# Patient Record
Sex: Male | Born: 1935 | Race: White | Hispanic: No | Marital: Married | Smoking: Former smoker
Health system: Southern US, Community
[De-identification: ages and names within clinical notes are randomized; demographics above are authoritative.]

## PROBLEM LIST (undated history)

## (undated) DIAGNOSIS — R739 Hyperglycemia, unspecified: Secondary | ICD-10-CM

## (undated) DIAGNOSIS — I5030 Unspecified diastolic (congestive) heart failure: Secondary | ICD-10-CM

## (undated) DIAGNOSIS — I2510 Atherosclerotic heart disease of native coronary artery without angina pectoris: Secondary | ICD-10-CM

## (undated) DIAGNOSIS — R079 Chest pain, unspecified: Secondary | ICD-10-CM

## (undated) DIAGNOSIS — I10 Essential (primary) hypertension: Secondary | ICD-10-CM

## (undated) HISTORY — DX: Unspecified diastolic (congestive) heart failure: I50.30

## (undated) HISTORY — PX: TOTAL HIP ARTHROPLASTY: SHX124

---

## 2011-05-20 ENCOUNTER — Inpatient Hospital Stay: Payer: Self-pay | Admitting: Cardiovascular Disease

## 2011-05-20 LAB — CBC
HCT: 41.2 % (ref 40.0–52.0)
HGB: 13.9 g/dL (ref 13.0–18.0)
HGB: 14.4 g/dL (ref 13.0–18.0)
MCH: 32.6 pg (ref 26.0–34.0)
MCHC: 34.1 g/dL (ref 32.0–36.0)
Platelet: 216 10*3/uL (ref 150–440)
RBC: 4.26 10*6/uL — ABNORMAL LOW (ref 4.40–5.90)
RBC: 4.33 10*6/uL — ABNORMAL LOW (ref 4.40–5.90)
RDW: 13 % (ref 11.5–14.5)
WBC: 11.6 10*3/uL — ABNORMAL HIGH (ref 3.8–10.6)

## 2011-05-20 LAB — COMPREHENSIVE METABOLIC PANEL
Albumin: 3.5 g/dL (ref 3.4–5.0)
Alkaline Phosphatase: 64 U/L (ref 50–136)
BUN: 23 mg/dL — ABNORMAL HIGH (ref 7–18)
Bilirubin,Total: 0.4 mg/dL (ref 0.2–1.0)
Chloride: 98 mmol/L (ref 98–107)
Creatinine: 1.22 mg/dL (ref 0.60–1.30)
EGFR (Non-African Amer.): >60
Glucose: 218 mg/dL — ABNORMAL HIGH (ref 65–99)
SGOT(AST): 53 U/L — ABNORMAL HIGH (ref 15–37)
SGPT (ALT): 52 U/L
Total Protein: 7.8 g/dL (ref 6.4–8.2)

## 2011-05-20 LAB — TROPONIN I
Troponin-I: 0.19 ng/mL — ABNORMAL HIGH
Troponin-I: 7.15 ng/mL — ABNORMAL HIGH
Troponin-I: 10.43 ng/mL — ABNORMAL HIGH

## 2011-05-20 LAB — BASIC METABOLIC PANEL
BUN: 20 mg/dL — ABNORMAL HIGH (ref 7–18)
Calcium, Total: 9.7 mg/dL (ref 8.5–10.1)
Co2: 31 mmol/L (ref 21–32)
EGFR (African American): >60
Glucose: 107 mg/dL — ABNORMAL HIGH (ref 65–99)
Osmolality: 284 (ref 275–301)
Potassium: 4.7 mmol/L (ref 3.5–5.1)
Sodium: 141 mmol/L (ref 136–145)

## 2011-05-20 LAB — CK TOTAL AND CKMB (NOT AT ARMC)
CK, Total: 100 U/L (ref 35–232)
CK, Total: 331 U/L — ABNORMAL HIGH (ref 35–232)

## 2011-05-20 LAB — PROTIME-INR: INR: 1.2

## 2011-05-20 LAB — APTT
Activated PTT: 117.6 secs — ABNORMAL HIGH (ref 23.6–35.9)
Activated PTT: 117.5 secs — ABNORMAL HIGH (ref 23.6–35.9)

## 2011-05-21 LAB — CBC
HCT: 39.8 % — ABNORMAL LOW (ref 40.0–52.0)
MCHC: 34.3 g/dL (ref 32.0–36.0)
Platelet: 215 10*3/uL (ref 150–440)
RDW: 13.3 % (ref 11.5–14.5)
WBC: 11.8 10*3/uL — ABNORMAL HIGH (ref 3.8–10.6)

## 2011-05-21 LAB — BASIC METABOLIC PANEL
Anion Gap: 12 (ref 7–16)
BUN: 21 mg/dL — ABNORMAL HIGH (ref 7–18)
Chloride: 99 mmol/L (ref 98–107)
Creatinine: 0.99 mg/dL (ref 0.60–1.30)
Glucose: 113 mg/dL — ABNORMAL HIGH (ref 65–99)
Potassium: 3.8 mmol/L (ref 3.5–5.1)
Sodium: 139 mmol/L (ref 136–145)

## 2011-05-21 LAB — LIPID PANEL
Cholesterol: 146 mg/dL (ref 0–200)
HDL Cholesterol: 21 mg/dL — ABNORMAL LOW (ref 40–60)
Ldl Cholesterol, Calc: 81 mg/dL (ref 0–100)
Triglycerides: 219 mg/dL — ABNORMAL HIGH (ref 0–200)
VLDL Cholesterol, Calc: 44 mg/dL — ABNORMAL HIGH (ref 5–40)

## 2011-05-21 LAB — HEMOGLOBIN A1C: Hemoglobin A1C: 6.7 % — ABNORMAL HIGH (ref 4.2–6.3)

## 2011-05-21 LAB — PROTIME-INR
INR: 1.1
Prothrombin Time: 14.7 secs (ref 11.5–14.7)

## 2011-05-25 HISTORY — PX: CORONARY ARTERY BYPASS GRAFT: SHX141

## 2014-02-27 ENCOUNTER — Ambulatory Visit: Payer: Self-pay | Admitting: Internal Medicine

## 2014-02-27 ENCOUNTER — Observation Stay: Payer: Self-pay | Admitting: Internal Medicine

## 2014-02-27 LAB — BASIC METABOLIC PANEL
Anion Gap: 8 (ref 7–16)
BUN: 19 mg/dL — ABNORMAL HIGH (ref 7–18)
CALCIUM: 8.4 mg/dL — AB (ref 8.5–10.1)
Chloride: 102 mmol/L (ref 98–107)
Co2: 28 mmol/L (ref 21–32)
Creatinine: 1.26 mg/dL (ref 0.60–1.30)
EGFR (Non-African Amer.): 58 — ABNORMAL LOW
GLUCOSE: 144 mg/dL — AB (ref 65–99)
OSMOLALITY: 280 (ref 275–301)
POTASSIUM: 3.6 mmol/L (ref 3.5–5.1)
Sodium: 138 mmol/L (ref 136–145)

## 2014-02-27 LAB — CBC WITH DIFFERENTIAL/PLATELET
BASOS ABS: 0.1 10*3/uL (ref 0.0–0.1)
Basophil %: 0.9 %
EOS ABS: 0.2 10*3/uL (ref 0.0–0.7)
EOS PCT: 1.1 %
HCT: 44.7 % (ref 40.0–52.0)
HGB: 15 g/dL (ref 13.0–18.0)
LYMPHS ABS: 3.5 10*3/uL (ref 1.0–3.6)
LYMPHS PCT: 25.7 %
MCH: 32.7 pg (ref 26.0–34.0)
MCHC: 33.5 g/dL (ref 32.0–36.0)
MCV: 98 fL (ref 80–100)
MONOS PCT: 10.9 %
Monocyte #: 1.5 x10 3/mm — ABNORMAL HIGH (ref 0.2–1.0)
Neutrophil #: 8.3 10*3/uL — ABNORMAL HIGH (ref 1.4–6.5)
Neutrophil %: 61.4 %
Platelet: 240 10*3/uL (ref 150–440)
RBC: 4.59 10*6/uL (ref 4.40–5.90)
RDW: 13.6 % (ref 11.5–14.5)
WBC: 13.5 10*3/uL — ABNORMAL HIGH (ref 3.8–10.6)

## 2014-02-27 LAB — CK TOTAL AND CKMB (NOT AT ARMC)
CK, TOTAL: 86 U/L (ref 39–308)
CK, TOTAL: 76 U/L (ref 39–308)
CK, Total: 92 U/L (ref 39–308)
CK-MB: 1.5 ng/mL (ref 0.5–3.6)
CK-MB: 1.4 ng/mL (ref 0.5–3.6)
CK-MB: 1.2 ng/mL (ref 0.5–3.6)

## 2014-02-27 LAB — TROPONIN I
Troponin-I: <0.02 ng/mL
Troponin-I: <0.02 ng/mL
Troponin-I: <0.02 ng/mL

## 2014-06-30 NOTE — H&P (Signed)
PATIENT NAME:  [name], [name] MR#:  [id number] DATE OF BIRTH:  [birth year]  DATE OF ADMISSION:  02/27/2014  PRIMARY CARE PHYSICIAN:  Rockney Gheehelsea Holland, nurse practitioner with [facility].    CARDIOLOGIST: Dr. Juliann Paresallwood.   CHIEF COMPLAINT: "I passed out today."  HISTORY OF PRESENT ILLNESS: [name] is an 81 year old Caucasian gentleman with past medical history of coronary artery disease status post CABG, history of hypertension,  comes to the Emergency Room after he had a syncopal episode at [employer] today. The patient had a routine cardiac catheterization scheduled by Dr. Juliann Paresallwood which was done by Dr. Juliann Paresallwood this morning.  The patient went home after he was told to continue medical management. No intervention was performed. I do not have the results of the cardiac catheterization that was done today. The patient after the test directly went to [employer], had some eggs and grits along with [place] sausage and some hash browns. Immediately after that in a few minutes he started feeling sick to his stomach, he laid his head down on the table and by the time family could grab hold of him he collapsed and family laid him down on the floor. EMS came by and once EMS arrived his blood pressure was systolic in the 70s. He was brought to the Emergency Room, received IV fluids, his blood pressure systolic is 107. He had multiple episodes of vomiting. He still feels "queasy" and nauseated. He is being admitted for syncope likely vasovagal with possible secondary to nausea and upset stomach after his meal.   PAST MEDICAL HISTORY:  1.  Coronary artery disease status post CABG in the past.  2.  Recent cardiac catheterization today which results not available. The patient was told to continue medical management.  3.  Hypertension.   ALLERGIES:  PENICILLIN.   FAMILY HISTORY: Positive for hypertension.   SOCIAL HISTORY: Married, lives with wife. Nonsmoker. Denies any alcohol use.  HOME  MEDICATIONS:  1. Allopurinol 100 mg daily.  2. Amlodipine 10 mg daily.  3. Aspirin 81 mg daily.  4. Lasix 20 mg daily.  5. Metoprolol 25 mg b.i.d.  6. Ramipril daily.  7. Nitrostat 0.4 mg sublingual every 5 minutes as needed for chest pain.   REVIEW OF SYSTEMS:  CONSTITUTIONAL: No fever. Positive for fatigue and weakness. No pain, weight loss, weight gain.  EYES: No blurred or double vision.  No glaucoma or cataracts.  EARS, NOSE, AND THROAT: No tinnitus, ear pain, epistaxis, discharge.   RESPIRATORY: No cough, wheeze, hemoptysis, or dyspnea.  CARDIOVASCULAR: No chest pain, orthopnea, edema. Positive for hypotension.  GASTROINTESTINAL: Positive for nausea, vomiting. No hematemesis, melena, or rectal bleed.  GENITOURINARY: No dysuria, hematuria, frequency.  ENDOCRINE: No polyuria, nocturia, or thyroid problems.  HEMATOLOGY: No anemia or easy bruising or bleeding.   SKIN:  No acne, rash, or lesion.  MUSCULOSKELETAL:  Positive for back pain.  NEUROLOGIC: No CVA, TIA dysarthria PSYCHIATRIC: No anxiety or depression.   All other systems reviewed and negative.   PHYSICAL EXAMINATION:  GENERAL: The patient is awake, alert, oriented x 3, not in acute distress.  VITAL SIGNS: Afebrile. Pulse is 73 and regular, blood pressure is 137/72, saturation is 100% on room air.  HEENT: Atraumatic, normocephalic. Pupils PERRLA. EOMI intact. Oral mucosa is moist.  NECK: Supple. No JVD. No carotid bruit.  LUNGS: Clear to auscultation bilaterally. No rales, rhonchi, respiratory distress, labored breathing.  CARDIOVASCULAR: Both heart sounds are normal. Rate and rhythm regular. PMI not lateralized. Chest nontender.  EXTREMITIES: Good pedal pulses, good femoral pulses. No lower extremity edema. ABDOMEN: Soft, benign, nontender. No organomegaly. Positive bowel sounds.  NEUROLOGIC: Grossly intact cranial nerves II through XII. No motor or sensory deficit. PSYCHIATRIC: The patient is awake, alert, oriented x  3.   EKG shows normal sinus rhythm. Right bundle branch block. No acute ST elevation or depression.    LABORATORY DATA:  CBC within normal limits. No acute findings on CT of the chest, abdomen, and pelvis, extensive aortic atherosclerosis, no aortic dissection or aneurysm. Extensive anatomic radiations including azygos continuation of the IVC, absence of normal midgut bowel rotation variant, aortic branching in the abdomen.   H and H is 15 and hematocrit is 44.7. Glucose is 144, BUN 19, creatinine is 1.2, sodium is 138, potassium 3.6, chloride is 102, bicarbonate is 28. Troponin is less than 0.02.   ASSESSMENT AND PLAN: An 81 year old, [name], with history of hypertension, history of coronary artery disease status post coronary artery bypass graft, comes in with:   1.  Syncopal episode. Comes in with syncopal episode, appears vasovagal in the setting of a heavy meal which possibly upset his stomach after cardiac catheterization this morning. The patient has had multiple episodes of vomiting. He is having some dry heaves right now. We will admit him for overnight observation, monitor cardiac enzymes, monitor on telemetry. CT abdomen and chest is negative for any retroperitoneal bleed. H and H are stable. Dr. Juliann Pares is aware of the patient being admitted. We will consult cardiology only if needed. Give IV fluids.  2.  Hypertension. We will resume home medications once blood pressure is more stable. 3.  Deep vein thrombosis prophylaxis. Subcutaneous heparin t.i.d.   Above was discussed with the patient and the patient's family members who were present in the Emergency Room.   TIME SPENT: 50 minutes.     ____________________________ Wylie Hail Allena Katz, MD sap:bu D: 02/27/2014 17:07:46 ET T: 02/27/2014 17:44:18 ET JOB#: [id number]  cc: Cynai Skeens A. Allena Katz, MD, <Dictator> Willow Ora MD ELECTRONICALLY SIGNED 03/02/2014 17:05

## 2014-07-01 NOTE — Discharge Summary (Signed)
PATIENT NAMLewis Erickson:  Eppes, [name] DATE OF BIRTH:  October 22, 1930  DATE OF ADMISSION:  05/20/2011 DATE OF DISCHARGE:  05/21/2011  ADMITTING DIAGNOSES:  1. Chest pain.  2. Hypertension.  DISCHARGE/TRANSFER DIAGNOSIS:  Non-ST elevation myocardial infarction.   HISTORY OF PRESENT ILLNESS: [name] is a 81 year old pleasant white male with a past medical history significant for hypertension who woke up yesterday at 3:00 a.m. to use the restroom. During that time he had substernal chest pain that radiated to his left axillary region. He thought the pain might have been indigestion and tried some Pepsi and antacids and belched and his symptoms resolved after about 10 minutes. He did not have any shortness of breath or diaphoresis but proceeded to the Emergency Department where he was given oxygen, nitroglycerin, beta blocker, and had a chest x-ray and EKG showing normal sinus rhythm with first degree AV block and no significant ST or T wave acute changes. The patient's initial troponin I and cardiac enzymes were negative; however, the second and third sets of enzymes were markedly abnormal with troponin I in the 10s and later in the 15s. The patient was brought to the cardiac catheterization lab where he was found to have significant three-vessel coronary artery disease with a high-grade lesion in the proximal circumflex of approximately 99%, which is probably the culprit for his non-ST elevation myocardial infarction. The patient did not have any associated chest pain on medical therapy after his initial ED visit and has been taking aspirin, heparin, beta blocker, Integrilin drip, and was clinically stable.   LABORATORY, DIAGNOSTIC, AND RADIOLOGICAL DATA:  Glucose 135, BUN 21, creatinine 0.99, sodium 139, potassium 3.8, chloride 99, CO2 28, estimated GFR is greater than 60, LDL 81, VLDL 44, total cholesterol is 146, triglycerides 219. HDL 21, hemoglobin A1c 6.7. Troponin I from 22:43  today was 10.43. White blood cell count 11.8, hemoglobin 13.7, hematocrit 39.8, platelet count 215,000. PT 14.7, INR 1.1, PTT 112.4. Echocardiogram 05/20/2011 with mild LV dysfunction with anterior hypokinesis and mild mitral regurgitation.  Cardiac catheterization 05/21/2011: Left ventricular ejection fraction calculated by contrast ventriculography was 50%, mid LAD with 75% stenosis, first diagonal with 70% stenosis, proximal circumflex with 99% stenosis, first obtuse marginal 80% stenosis, second obtuse marginal 60% stenosis, proximal RCA 80% stenosis, distal RCA 95% stenosis, right posterior lateral segment 85% stenosis.  Chest x-ray: No acute cardiopulmonary disease, mild prominent lobulation along the right superior hilum which may be related to prominent central pulmonary vasculature. Hilar/mediastinal mass cannot be excluded.   DISCHARGE INSTRUCTIONS: [facility] on [facility]. The patient has significant three-vessel coronary artery disease with a high-grade lesion, 99% in the proximal circumflex, which is probably the culprit for his non-ST elevation myocardial infarction. The patient will be transferred with aspirin, heparin, Integrilin drip, and is clinical clinically stable for transfer. He should follow up in our office after coronary artery bypass grafting, which is [facility].   Thank you very much for allowing us to participate in this patient's care.  ____________________________ Verta EllenMonica A. Apphia Cropley, PA-C for Adrian BlackwaterShaukat Khan, MD mam:bjt D: 05/21/2011 16:31:35 ET T: 05/22/2011 10:33:13 ET JOB#: [id number]  cc: Verta EllenMonica A. Janitza Revuelta, PA-C, <Dictator> Dr. Roel Cluckean, [place]  Melquiades Kovar A [facility] PA ELECTRONICALLY SIGNED 05/22/2011 13:39

## 2014-07-01 NOTE — H&P (Signed)
PATIENT NAME:  [name], POTTS MR#:  [id number] DATE OF BIRTH:  09/04/1930  DATE OF ADMISSION:  05/20/2011  ED REFERRING PHYSICIAN: Dr. Marilynne Halsted PRIMARY CARE PHYSICIAN: Dr. August Saucer in [place]    CHIEF COMPLAINT: Chest pressure.   HISTORY OF PRESENT ILLNESS: Patient is a 81 year old white male with history of hypertension, osteoarthritis who reports that he was sleeping and woke up around 3:00 a.m. went to the bathroom when he started having chest pressure. He reports that it felt like elephant sitting on his chest so he took two tablets of antacid and he burped and that did seem to help relieve his symptoms. However, patient reports also that he has been experiencing similar symptoms for the past one month. Patient did not have any radiation of the pain today to his neck or jaw. He did have some pain in his left armpit. He reports that over the past one month he has been having chest pressure mainly with exertion and it gets relieved. Also had it a few days ago while he was sitting. He otherwise denies any nocturnal dyspnea, orthopnea. No lower extremity swelling. He also was noted to have a blood sugar of 218 in the ED. He reports no history of diabetes. Denies any excessive thirst or excessive urination.   PAST MEDICAL HISTORY:  1. Hypertension.  2. Osteoarthritis.   PAST SURGICAL HISTORY: None.   ALLERGIES: None.   MEDICATIONS:  1. HCTZ 25 p.o. daily.  2. Aspirin 81, 1 tab p.o. daily.  3. Altace 5 mg p.o. daily.   SOCIAL HISTORY: Does not smoke. Does not drink. No drugs.   FAMILY HISTORY: Father with coronary artery disease. He is not sure the timing or at what age.  REVIEW OF SYSTEMS: CONSTITUTIONAL: Denies any fevers, fatigue, weakness. Has pain related to osteoarthritis. No weight loss. No weight gain. EYES: No blurred or double vision. No pain. No redness. No inflammation. No glaucoma. No cataracts. ENT: No tinnitus. No ear pain. No hearing loss. No seasonal or year-round  allergies. No epistaxis. No nasal discharge. No snoring. No postnasal drip. No sinus pain. No redness of the oropharynx. No difficulty swallowing. RESPIRATORY: No cough. No wheezing. No hemoptysis. No dyspnea. No asthma. No chronic obstructive pulmonary disease. No tuberculosis. No pneumonia. CARDIOVASCULAR: Chest pressure as above. No orthopnea. No edema. No arrhythmia. No palpitations. Has high has high blood pressure. GASTROINTESTINAL: No nausea, vomiting, diarrhea. No abdominal pain. No hematemesis. No melena. No gastroesophageal reflux disease. No irritable bowel syndrome. No jaundice. No rectal bleeding. GENITOURINARY: Denies any dysuria, hematuria, renal calculus, frequency or incontinence. ENDO: Denies any polyuria, nocturia, or thyroid problems. HEME/LYMPH: Denies any anemia, easy bruisability, or bleeding. SKIN: No acne. No rash. No change in mole, hair or skin. MUSCULOSKELETAL: Has pain in his back, hips, knees related to osteoarthritis. No gout. NEUROLOGIC: No numbness. No cerebrovascular accident. No transient ischemic attack. No seizures. PSYCHIATRIC: No anxiety. No insomnia. No ADD. No OCD.   PHYSICAL EXAMINATION:  VITAL SIGNS: Temperature 97.8, pulse 62, respirations 18, blood pressure 139/67, O2 100%.   GENERAL: Patient is an obese male in no acute distress.   HEENT: Head atraumatic, normocephalic. Pupils equally round, reactive to light and accommodation. Sclerae anicteric. No conjunctival pallor. Oropharynx clear without any exudates. Nasal exam shows no drainage or ulceration. Ear exam shows no drainage or erythema.   CARDIOVASCULAR: Regular rate and rhythm. No murmurs, rubs, clicks, or gallops. PMI is not displaced.   LUNGS: Clear to auscultation bilaterally without any rales, rhonchi, wheezing.  ABDOMEN: Soft, nontender, nondistended. Positive bowel sounds x4.   EXTREMITIES: No clubbing, cyanosis, edema.   NEUROLOGIC: Awake, alert, oriented x3. No focal deficits.   SKIN:  There is no rash.   LYMPHATICS: No lymph nodes palpable.   MUSCULOSKELETAL: There is no erythema or swelling.   PSYCHIATRIC: Not anxious or depressed.   VASCULAR: Good DP, PT pulses.   LABORATORY, DIAGNOSTIC AND RADIOLOGICAL DATA: EKG shows right bundle branch block without any acute ST elevation or depression. Glucose 218, BUN 23, creatinine 1.22, sodium 140, potassium 4.0, chloride 98, CO2 29, calcium 9.6. LFTs: Total protein 7.8, albumin 3.5, bilirubin total 0.4, alkaline phosphatase 64, AST 53, ALT 52. CPK 100, CK-MB 1.7, troponin 0.19. WBC 9.4, hemoglobin 13.9, platelet count 229. Chest x-ray shows no acute cardiopulmonary processes. Mild prominent lobulation along the right superior hilum may be related to prominent central pulmonary vasculature. Follow-up chest x-ray recommended.   ASSESSMENT AND PLAN: Patient is a 81 year old white male with history of hypertension, osteoarthritis presents with chest pressure which woke him up. Patient has been having chest pressure with exertion ongoing for past one month.  1. Chest pressure concerning for unstable angina. At this time will go ahead and admit the patient to telemetry. Start him on heparin drip. Place him on aspirin, metoprolol. I will start him on Lipitor. Will check a fasting lipid panel in the a.m. I have spoken to Dr. Welton FlakesKhan who will see the patient later today. Will give him p.r.n. nitroglycerin as needed.  2. Elevated blood glucose, likely new onset diabetes. At this time will do sliding scale insulin. Check a hemoglobin A1c.   3. Hypertension. Will place him on metoprolol. Hold HCTZ and Altace for now.  4. Abnormal chest x-ray. Will need outpatient follow up. Repeat chest x-ray as recommended by radiology.        TIME SPENT:  35 minutes.   ____________________________ Lacie ScottsShreyang H. Allena KatzPatel, MD shp:cms D: 05/20/2011 08:44:44 ET T: 05/20/2011 09:39:14 ET JOB#: [id number]  cc: Malia Corsi H. Allena KatzPatel, MD, <Dictator> Dr. [place]   Almer Littleton Molinda BailiffH Cobi Aldape MD ELECTRONICALLY SIGNED 05/23/2011 13:13

## 2014-07-01 NOTE — Consult Note (Signed)
Brief Consult Note: Diagnosis: Chest pain-patient had episode of CP early this am that has resolved. EKG with no acute changes.   Consult note dictated.   Orders entered.   Discussed with Attending MD.   Comments: Await second set of TNI, if elevated or has CP will proceed with cardiac cath. If no gross elevations, then nuclaer stress test. Patient on heparin gtt, beta-blockers, nitrates. Will get echo to r/o wall motion abnormalities and follow patient.  Electronic Signatures: Radene KneeKhan, Maeci Kalbfleisch Ali (MD)   (Signed 14-Mar-13 10:38)  Authored: Brief Consult Note Manzi, Monica A (PA-C)   Entered: Brief Consult Note  Last Updated: 14-Mar-13 10:38 by Radene KneeKhan, Amiayah Giebel Ali (MD)

## 2014-07-01 NOTE — Consult Note (Signed)
PATIENT NAME:  [name], [name] MR#:  [id number] DATE OF BIRTH:  [birth year]  DATE OF CONSULTATION:  05/20/2011  REFERRING PHYSICIAN:  Auburn Bilberry, MD  CONSULTING PHYSICIAN:  Verta Ellen, PA-C  PRIMARY CARE PHYSICIAN: Dr. August Saucer in [place]    REASON FOR CONSULTATION: Chest pain.   HISTORY OF PRESENT ILLNESS: [name] is a pleasant 81 year old white male with a past medical history significant for hypertension who woke up at 3 a.m. to use the restroom. During that time he complained of substernal chest pain that radiated into his left axillary region and had pressure in that area. He took two TUMS, drank some Pepsi, belched and notes that his pain eased off. Pain lasted for a total of 5 to 10 minutes and was not associated with any shortness of breath or diaphoresis. He currently is chest pain free resting comfortably in the bed and denies any shortness of breath, palpitations, orthopnea, PND, and peripheral edema. He has never had any testing done on his heart in the past.   PAST MEDICAL HISTORY:  1. Hypertension. 2. Osteoporosis.   PAST SURGICAL HISTORY: None.  ALLERGIES: Tetanus shot caused edema and pruritus.   HOME MEDICATIONS:  1. Aspirin 81 mg p.o. daily.  2. Ramipril 5 mg p.o. daily.  3. Hydrochlorothiazide 25 mg 1 tablet p.o. daily.   SOCIAL HISTORY: The patient is married. He is a retired [employer] information [employer] manager. He does not smoke but smoked 1 to 2 packs per day and quit in 1990. He states he only smoked a "couple of years". He denies any alcohol or illicit drug use.   FAMILY HISTORY: His father died of an MI at age 48. Sister had throat cancer.   REVIEW OF SYSTEMS: The patient denies fevers, fatigue, weakness, unintentional weight loss, shortness of breath, coughing, wheezing, abdominal pain, edema. See the history of present illness.   PHYSICAL EXAMINATION:   GENERAL: This is an obese white male who is not in any acute distress. He is alert and  oriented x3.   VITAL SIGNS: Temperature 97.8 degrees Fahrenheit, heart rate 63, respiratory rate 20, blood pressure 156/76, oxygen saturation 99% on room air.   HEENT: Head atraumatic, normocephalic.   EYES: Pupils are round, equal bilaterally, reactive to light. There is no scleral icterus. Conjunctivae pale, pink. Ears and nose are normal to external inspection.   MOUTH: Dentures in place.   NECK: Supple. Trachea is midline. Thyroid is smooth and mobile.   LUNGS: Clear to auscultation bilaterally with no adventitious breath sounds appreciated.   CARDIOVASCULAR: Regular rate and rhythm. No murmurs, rubs, or gallops noted.   ABDOMEN: Nondistended. Bowel sounds soft. Bowel sounds present in all four quadrants. No rebound tenderness, guarding, or hepatosplenomegaly noted.   EXTREMITIES: The patient has trace pedal edema.   ANCILLARY DATA: EKG with normal sinus rhythm, 76 beats per minute, first degree AV block and right bundle branch block.   Glucose 218, BUN 23, creatinine 1.22, sodium 140, potassium 4.0, chloride 98, CO2 29, estimated GFR greater than 60, total protein 7.8, hemoglobin 13.9, hematocrit 41.2, platelet count 226,000. PTT 30.   Chest x-ray no acute cardiopulmonary disease, mild prominent lobulation along the right superior hilum.   ASSESSMENT/PLAN: Chest pain. The patient has a history of hypertension that has been controlled at home and was awakened with substernal chest pain. There are no acute EKG changes today and his troponin-I is very mildly elevated. Will continue to monitor cardiac enzymes and if enzymes elevated and/or  the patient has chest pain will consider cardiac catheterization versus nuclear stress testing. Will also order echocardiogram to rule out any wall motion abnormalities. The patient has already been started on heparin drip, beta-blockers, and nitroglycerin which we agree with. Will continue to monitor this patient with you.   Thank you very much for  this consultation and allowing us to participate in this patient's care. The patient was discussed with Dr. Adrian BlackwaterShaukat Khan who agrees with the assessment and management of this patient as noted above.   ____________________________ Verta EllenMonica A. Julian Askin, PA-C mam:drc D: 05/20/2011 11:55:43 ET T: 05/20/2011 13:10:38 ET JOB#: [id number]  cc: Verta EllenMonica A. Neftaly Swiss, PA-C, <Dictator> Dr. [place], [facility]  Kimanh Templeman A [facility] PA ELECTRONICALLY SIGNED 05/21/2011 8:33

## 2014-07-04 NOTE — Discharge Summary (Signed)
PATIENT NAMLewis Erickson:  Erickson, [name] MR#:  [id number] DATE OF BIRTH:  1930-03-21  DATE OF ADMISSION:  02/27/2014 DATE OF DISCHARGE:  02/28/2014  PRIMARY CARE PHYSICIAN: Gary OraSona A Patel, MD   DISCHARGING PHYSICIAN:  Gary Baasadhika Rodneshia Greenhouse, MD   PRIMARY CARE PHYSICIAN:  Gary GaribaldiSheikh A Tejan-Sie, MD   PRIMARY CARDIOLOGIST:  Gary NancyShaukat A Khan, MD   DISCHARGE DIAGNOSES:  1.  Vasovagal syncope.  2.  Coronary artery disease.  3.  History of congestive heart failure, systolic dysfunction, ejection fraction 35% to 50%.  4.  Hypertension.   DISCHARGE HOME MEDICATIONS:    1.  Aspirin 81 mg p.o. daily.  2.  Sublingual nitroglycerin 0.4 mg every 5 minutes as needed for chest pain.  3.  Metoprolol 25 mg p.o. b.i.d.  4.  Ramipril 10 mg p.o. daily.  5.  Allopurinol 100 mg p.o. daily.  6.  Norvasc 10 mg p.o. daily.   DISCHARGE DIET: Low-sodium diet.   DISCHARGE ACTIVITY: As tolerated.   FOLLOW-UP INSTRUCTIONS:   1.  Cardiology follow-up in one week.  2.  Primary care physician follow-up in one week.  3.  Advised to hold Lasix until January.  LABORATORY AND IMAGING STUDIES:  Prior to discharge, troponins came in negative. Chest x-ray showing clear lung fields. No acute cardiopulmonary disease. CT of the chest, abdomen and pelvis showing no acute findings identified, extensive right leg atherosclerosis, no  dissection, aneurysm, extensive anatomic  variations noted of azygous vein, nonspecific.   WBC 13.5, hemoglobin 15.3, hematocrit 44.7, platelet count 240,000. Sodium 138, potassium 3.6, chloride 102, bicarbonate 28, BUN 19, creatinine 1.26, glucose 144, calcium of 8.4.   BRIEF HOSPITAL COURSE:  [name] is an 81 year old male with past medical history significant for coronary artery disease status post bypass graft surgery, hypertension, and recent cardiac catheterization done on 02/27/2014. Post catheterization the patient went to eat at [employer] and passed out..  1.  Vasovagal syncope. Denies any  cardiac symptoms prior to that, well-dehydrated. Is taking Lasix at home, he just had a cardiac catheter, which showed nonspecific obstructions but medical management recommended. The patient's troponins were negative. He did not have any chest pain or dyspnea on exertion, so he is being discharged home and advised to follow up with Dr. Welton FlakesKhan as an outpatient. He is on cardiac medications and experienced congestive heart failure, the recent cardiac catheterization done yesterday showed ejection fraction 50%.  Since he appeared to be a little bit dehydrated his Lasix is being held at this time and advised to restart it in about a week after checking with PCP.  2.  Hypertension. Advised to continue his home medications.  His course has been otherwise uneventful in the hospital.   DISCHARGE CONDITION: Stable.   DISCHARGE DISPOSITION: Home.   TIME SPENT:  On  discharge was 40 minutes.   ____________________________ Gary Baasadhika Tilton Marsalis, MD rk:at D: 02/28/2014 16:27:09 ET T: 02/28/2014 17:10:11 ET JOB#: [id number]  cc: Gary Baasadhika Cartrell Bentsen, MD, <Dictator> Gary NancyShaukat A. Khan, MD  Gary BaasADHIKA Micky Sheller MD ELECTRONICALLY SIGNED 03/13/2014 18:33

## 2017-07-05 ENCOUNTER — Other Ambulatory Visit: Payer: Self-pay

## 2017-07-05 ENCOUNTER — Observation Stay (HOSPITAL_COMMUNITY)
Admission: EM | Admit: 2017-07-05 | Discharge: 2017-07-06 | Disposition: A | Discharge status: Home / Self Care | Payer: Medicare Other | Attending: Internal Medicine | Admitting: Internal Medicine

## 2017-07-05 ENCOUNTER — Encounter (HOSPITAL_COMMUNITY): Payer: Self-pay | Admitting: Internal Medicine

## 2017-07-05 ENCOUNTER — Emergency Department (HOSPITAL_COMMUNITY): Payer: Medicare Other

## 2017-07-05 DIAGNOSIS — I259 Chronic ischemic heart disease, unspecified: Secondary | ICD-10-CM

## 2017-07-05 DIAGNOSIS — I5022 Chronic systolic (congestive) heart failure: Secondary | ICD-10-CM | POA: Insufficient documentation

## 2017-07-05 DIAGNOSIS — I25118 Atherosclerotic heart disease of native coronary artery with other forms of angina pectoris: Secondary | ICD-10-CM | POA: Diagnosis not present

## 2017-07-05 DIAGNOSIS — E1165 Type 2 diabetes mellitus with hyperglycemia: Secondary | ICD-10-CM | POA: Diagnosis not present

## 2017-07-05 DIAGNOSIS — Z7982 Long term (current) use of aspirin: Secondary | ICD-10-CM | POA: Insufficient documentation

## 2017-07-05 DIAGNOSIS — I509 Heart failure, unspecified: Secondary | ICD-10-CM

## 2017-07-05 DIAGNOSIS — R55 Syncope and collapse: Secondary | ICD-10-CM | POA: Diagnosis not present

## 2017-07-05 DIAGNOSIS — I252 Old myocardial infarction: Secondary | ICD-10-CM | POA: Insufficient documentation

## 2017-07-05 DIAGNOSIS — Z951 Presence of aortocoronary bypass graft: Secondary | ICD-10-CM | POA: Diagnosis not present

## 2017-07-05 DIAGNOSIS — R739 Hyperglycemia, unspecified: Secondary | ICD-10-CM | POA: Diagnosis present

## 2017-07-05 DIAGNOSIS — I5032 Chronic diastolic (congestive) heart failure: Secondary | ICD-10-CM

## 2017-07-05 DIAGNOSIS — I110 Hypertensive heart disease with heart failure: Secondary | ICD-10-CM | POA: Insufficient documentation

## 2017-07-05 DIAGNOSIS — I10 Essential (primary) hypertension: Secondary | ICD-10-CM | POA: Diagnosis present

## 2017-07-05 DIAGNOSIS — I2510 Atherosclerotic heart disease of native coronary artery without angina pectoris: Secondary | ICD-10-CM | POA: Diagnosis present

## 2017-07-05 DIAGNOSIS — R112 Nausea with vomiting, unspecified: Secondary | ICD-10-CM | POA: Insufficient documentation

## 2017-07-05 DIAGNOSIS — Z79899 Other long term (current) drug therapy: Secondary | ICD-10-CM | POA: Insufficient documentation

## 2017-07-05 DIAGNOSIS — R079 Chest pain, unspecified: Secondary | ICD-10-CM | POA: Diagnosis not present

## 2017-07-05 DIAGNOSIS — R110 Nausea: Secondary | ICD-10-CM | POA: Diagnosis present

## 2017-07-05 HISTORY — DX: Essential (primary) hypertension: I10

## 2017-07-05 HISTORY — DX: Chest pain, unspecified: R07.9

## 2017-07-05 HISTORY — DX: Atherosclerotic heart disease of native coronary artery without angina pectoris: I25.10

## 2017-07-05 HISTORY — DX: Hyperglycemia, unspecified: R73.9

## 2017-07-05 LAB — I-STAT TROPONIN, ED
Troponin i, poc: 0 ng/mL (ref 0.00–0.08)
Troponin i, poc: 0 ng/mL (ref 0.00–0.08)

## 2017-07-05 LAB — URINALYSIS, ROUTINE W REFLEX MICROSCOPIC
BILIRUBIN URINE: NEGATIVE
GLUCOSE, UA: NEGATIVE mg/dL
Hgb urine dipstick: NEGATIVE
KETONES UR: NEGATIVE mg/dL
LEUKOCYTES UA: NEGATIVE
NITRITE: NEGATIVE
PH: 5 (ref 5.0–8.0)
PROTEIN: NEGATIVE mg/dL
Specific Gravity, Urine: 1.014 (ref 1.005–1.030)

## 2017-07-05 LAB — GLUCOSE, CAPILLARY: GLUCOSE-CAPILLARY: 115 mg/dL — AB (ref 65–99)

## 2017-07-05 LAB — LIPID PANEL
Cholesterol: 128 mg/dL (ref 0–200)
HDL: 23 mg/dL — ABNORMAL LOW (ref >40)
LDL CALC: 51 mg/dL (ref 0–99)
Total CHOL/HDL Ratio: 5.6 RATIO
Triglycerides: 270 mg/dL — ABNORMAL HIGH (ref <150)
VLDL: 54 mg/dL — ABNORMAL HIGH (ref 0–40)

## 2017-07-05 LAB — COMPREHENSIVE METABOLIC PANEL
ALBUMIN: 3.7 g/dL (ref 3.5–5.0)
ALK PHOS: 75 U/L (ref 38–126)
ALT: 36 U/L (ref 17–63)
AST: 36 U/L (ref 15–41)
Anion gap: 12 (ref 5–15)
BILIRUBIN TOTAL: 0.6 mg/dL (ref 0.3–1.2)
BUN: 18 mg/dL (ref 6–20)
CALCIUM: 9.1 mg/dL (ref 8.9–10.3)
CO2: 25 mmol/L (ref 22–32)
Chloride: 101 mmol/L (ref 101–111)
Creatinine, Ser: 1.17 mg/dL (ref 0.61–1.24)
GFR calc Af Amer: >60 mL/min (ref >60)
GFR, EST NON AFRICAN AMERICAN: 55 mL/min — AB (ref >60)
GLUCOSE: 170 mg/dL — AB (ref 65–99)
Potassium: 3.9 mmol/L (ref 3.5–5.1)
Sodium: 138 mmol/L (ref 135–145)
TOTAL PROTEIN: 7.2 g/dL (ref 6.5–8.1)

## 2017-07-05 LAB — LIPASE, BLOOD: LIPASE: 27 U/L (ref 11–51)

## 2017-07-05 LAB — CBC WITH DIFFERENTIAL/PLATELET
BASOS PCT: 1 %
Basophils Absolute: 0.1 10*3/uL (ref 0.0–0.1)
EOS PCT: 3 %
Eosinophils Absolute: 0.3 10*3/uL (ref 0.0–0.7)
HEMATOCRIT: 39.7 % (ref 39.0–52.0)
HEMOGLOBIN: 13.6 g/dL (ref 13.0–17.0)
LYMPHS ABS: 4.3 10*3/uL — AB (ref 0.7–4.0)
LYMPHS PCT: 41 %
MCH: 32.9 pg (ref 26.0–34.0)
MCHC: 34.3 g/dL (ref 30.0–36.0)
MCV: 95.9 fL (ref 78.0–100.0)
MONOS PCT: 10 %
Monocytes Absolute: 1.1 10*3/uL — ABNORMAL HIGH (ref 0.1–1.0)
NEUTROS ABS: 4.7 10*3/uL (ref 1.7–7.7)
Neutrophils Relative %: 45 %
Platelets: 257 10*3/uL (ref 150–400)
RBC: 4.14 MIL/uL — ABNORMAL LOW (ref 4.22–5.81)
RDW: 13.7 % (ref 11.5–15.5)
WBC: 10.5 10*3/uL (ref 4.0–10.5)

## 2017-07-05 LAB — TROPONIN I: Troponin I: <0.03 ng/mL (ref <0.03)

## 2017-07-05 LAB — TSH: TSH: 1.404 u[IU]/mL (ref 0.350–4.500)

## 2017-07-05 MED ORDER — ENOXAPARIN SODIUM 40 MG/0.4ML ~~LOC~~ SOLN
40.0000 mg | SUBCUTANEOUS | Status: DC
  Filled 2017-07-05: qty 0.4

## 2017-07-05 MED ORDER — DIPHENHYDRAMINE HCL 50 MG/ML IJ SOLN
12.5000 mg | Freq: Once | INTRAMUSCULAR | Status: AC
  Administered 2017-07-05: 12.5 mg via INTRAVENOUS
  Filled 2017-07-05: qty 1

## 2017-07-05 MED ORDER — AMLODIPINE BESYLATE 10 MG PO TABS
10.0000 mg | ORAL_TABLET | Freq: Every day | ORAL | Status: DC
  Administered 2017-07-06: 10 mg via ORAL
  Filled 2017-07-05: qty 1

## 2017-07-05 MED ORDER — RAMIPRIL 10 MG PO CAPS
10.0000 mg | ORAL_CAPSULE | Freq: Every day | ORAL | Status: DC
  Administered 2017-07-06: 10 mg via ORAL
  Filled 2017-07-05: qty 1

## 2017-07-05 MED ORDER — INSULIN ASPART 100 UNIT/ML ~~LOC~~ SOLN: 0.0000 [IU] | Freq: Three times a day (TID) | SUBCUTANEOUS | Status: DC

## 2017-07-05 MED ORDER — ONDANSETRON HCL 4 MG/2ML IJ SOLN: 4.0000 mg | Freq: Four times a day (QID) | INTRAMUSCULAR | Status: DC | PRN

## 2017-07-05 MED ORDER — MORPHINE SULFATE (PF) 4 MG/ML IV SOLN: 2.0000 mg | INTRAVENOUS | Status: DC | PRN

## 2017-07-05 MED ORDER — METOPROLOL TARTRATE 25 MG PO TABS
25.0000 mg | ORAL_TABLET | Freq: Two times a day (BID) | ORAL | Status: DC
  Administered 2017-07-05 – 2017-07-06 (×2): 25 mg via ORAL
  Filled 2017-07-05 (×2): qty 1

## 2017-07-05 MED ORDER — FUROSEMIDE 20 MG PO TABS
20.0000 mg | ORAL_TABLET | Freq: Every day | ORAL | Status: DC
  Administered 2017-07-06: 20 mg via ORAL
  Filled 2017-07-05: qty 1

## 2017-07-05 MED ORDER — GI COCKTAIL ~~LOC~~: 30.0000 mL | Freq: Four times a day (QID) | ORAL | Status: DC | PRN

## 2017-07-05 MED ORDER — ASPIRIN EC 325 MG PO TBEC
325.0000 mg | DELAYED_RELEASE_TABLET | Freq: Every day | ORAL | Status: DC
  Administered 2017-07-06: 325 mg via ORAL
  Filled 2017-07-05: qty 1

## 2017-07-05 MED ORDER — ALLOPURINOL 100 MG PO TABS
100.0000 mg | ORAL_TABLET | Freq: Every day | ORAL | Status: DC
  Administered 2017-07-06: 100 mg via ORAL
  Filled 2017-07-05: qty 1

## 2017-07-05 MED ORDER — INSULIN ASPART 100 UNIT/ML ~~LOC~~ SOLN: 0.0000 [IU] | Freq: Every day | SUBCUTANEOUS | Status: DC

## 2017-07-05 MED ORDER — PROCHLORPERAZINE EDISYLATE 10 MG/2ML IJ SOLN
5.0000 mg | Freq: Once | INTRAMUSCULAR | Status: AC
  Administered 2017-07-05: 5 mg via INTRAVENOUS
  Filled 2017-07-05: qty 2

## 2017-07-05 MED ORDER — ACETAMINOPHEN 325 MG PO TABS: 650.0000 mg | ORAL_TABLET | ORAL | Status: DC | PRN

## 2017-07-05 NOTE — ED Triage Notes (Signed)
Patient arrived EMS,patient reported chest pain-before EMS arrival patient took 324 Aspirin and 1 Nitro.  EMS arrived and witnessed patient vomiting, he reports nausea-4mg  Zofran given by EMS.  Patient has vomited . Denies chest pain at this time, reports "feeling sick"

## 2017-07-05 NOTE — H&P (Signed)
History and Physical    [name] JXB:[id number] DOB: 06/01/1930 DOA: 07/05/2017  PCP: Patient, No Pcp Per Patient coming from: home  Chief Complaint: chest pain  HPI: [name] is a very pleasant  84 y.o. male with medical history significant for CAD status post CABG 5 years ago, hypertension,syncope, congestive heart failure systolic dysfunction ejection fraction 35-30% presents to emergency Department chief complaint of chest pain. [facility] are asked to admit for chest pain rule out  Information is obtained from the patient and his wife is at the bedside. He was his usual state of health until this morning he went outside and started bleeding in the garden. He states he walked out to the garden carrying his several and he had been taking for about 1 minute he experienced sudden sharp left anterior chest pain.he denies radiation of this pain. Associated symptoms include nausea with vomiting diaphoresis dizziness. He states he walked back into the kitchen sat down and took one sublingual nitroglycerin and an aspirin while his wife called EMS. The time EMS arrived chest pain had resolved. He has experienced persistent nausea and has vomited twice. No report of any coffee ground emesis. He denies abdominal pain dysuria hematuria frequency or urgency. He denies fever chills coughheadache lower extremity edema or orthopnea. He denies diarrhea constipation melena bright red blood per rectum. He does not believe he seen his cardiologist in almost 4 years.    ED Course: in the emergency department he's afebrile hemodynamically stable and not hypoxic.  Review of Systems: As per HPI otherwise all other systems reviewed and are negative.   Ambulatory Status:ambulates independently is independent with ADLs  Past Medical History:  Diagnosis Date  . CAD (coronary artery disease)   . Chest pain   . Heart failure (HCC)   . Hyperglycemia   . Hypertension     Past Surgical  History:  Procedure Laterality Date  . CORONARY ARTERY BYPASS GRAFT     2015    Social History   Socioeconomic History  . Marital status: Married    Spouse name: Not on file  . Number of children: Not on file  . Years of education: Not on file  . Highest education level: Not on file  Occupational History  . Not on file  Social Needs  . Financial resource strain: Not on file  . Food insecurity:    Worry: Not on file    Inability: Not on file  . Transportation needs:    Medical: Not on file    Non-medical: Not on file  Tobacco Use  . Smoking status: Not on file  Substance and Sexual Activity  . Alcohol use: Not on file  . Drug use: Not on file  . Sexual activity: Not on file  Lifestyle  . Physical activity:    Days per week: Not on file    Minutes per session: Not on file  . Stress: Not on file  Relationships  . Social connections:    Talks on phone: Not on file    Gets together: Not on file    Attends religious service: Not on file    Active member of club or organization: Not on file    Attends meetings of clubs or organizations: Not on file    Relationship status: Not on file  . Intimate partner violence:    Fear of current or ex partner: Not on file    Emotionally abused: Not on file    Physically  abused: Not on file    Forced sexual activity: Not on file  Other Topics Concern  . Not on file  Social History Narrative  . Not on file    No Known Allergies  Family History  Problem Relation Age of Onset  . Hypertension Mother     Prior to Admission medications   Medication Sig Start Date End Date Taking? Authorizing Provider  allopurinol (ZYLOPRIM) 100 MG tablet Take 100 mg by mouth daily. 06/24/17  Yes [provider]  amLODipine (NORVASC) 10 MG tablet Take 10 mg by mouth daily. 06/11/17  Yes [provider]  aspirin (ASPIRIN LOW DOSE) 81 MG tablet Take 81 mg by mouth daily.   Yes [provider]  furosemide (LASIX) 20 MG tablet  Take 20 mg by mouth daily. 06/11/17  Yes [provider]  metoprolol tartrate (LOPRESSOR) 25 MG tablet Take 25 mg by mouth 2 (two) times daily. 05/07/17  Yes [provider]  potassium chloride (MICRO-K) 10 MEQ CR capsule Take 10 mEq by mouth daily. 06/20/17  Yes [provider]  ramipril (ALTACE) 10 MG capsule Take 10 mg by mouth daily. 05/26/17  Yes [provider]    Physical Exam: Vitals:   07/05/17 1330 07/05/17 1345 07/05/17 1400 07/05/17 1415  BP: 132/81 135/65 (!) 141/61 131/69  Pulse: 70 74 73 71  Resp: 11 (!) Temp:      TempSrc:      SpO2: 97% 96% 96% 97%     General:  Appears calm and comfortable see up in bed in no acute distress face somewhat puffy appearing Eyes:  PERRL, EOMI, normal lids, iris ENT:  grossly normal hearing, lips & tongue, mucous membranes of his mouth are pink slightly dry Neck:  no LAD, masses or thyromegaly Cardiovascular:  RRR, no m/r/g. Trace LE edema.  Respiratory:  CTA bilaterally, no w/r/r. Normal respiratory effort. Abdomen:  soft, obese positive bowel sounds throughout nontender to palpation no guarding or rebounding Skin:  no rash or induration seen on limited exam Musculoskeletal:  grossly normal tone BUE/BLE, good ROM, no bony abnormality Psychiatric:  grossly normal mood and affect, speech fluent and appropriate, AOx3 Neurologic:  CN 2-12 grossly intact, moves all extremities in coordinated fashion, sensation intact quite hard of hearing. Speech clear facial symmetry oriented 3  Labs on Admission: I have personally reviewed following labs and imaging studies  CBC: Recent Labs  Lab 07/05/17 1120  WBC 10.5  NEUTROABS 4.7  HGB 13.6  HCT 39.7  MCV 95.9  PLT 257   Basic Metabolic Panel: Recent Labs  Lab 07/05/17 1120  NA 138  K 3.9  CL 101  CO2 25  GLUCOSE 170*  BUN 18  CREATININE 1.17  CALCIUM 9.1   GFR: CrCl cannot be calculated (Unknown ideal weight.). Liver Function  Tests: Recent Labs  Lab 07/05/17 1120  AST 36  ALT 36  ALKPHOS 75  BILITOT 0.6  PROT 7.2  ALBUMIN 3.7   Recent Labs  Lab 07/05/17 1120  LIPASE 27   No results for input(s): AMMONIA in the last 168 hours. Coagulation Profile: No results for input(s): INR, PROTIME in the last 168 hours. Cardiac Enzymes: No results for input(s): CKTOTAL, CKMB, CKMBINDEX, TROPONINI in the last 168 hours. BNP (last 3 results) No results for input(s): PROBNP in the last 8760 hours. HbA1C: No results for input(s): HGBA1C in the last 72 hours. CBG: No results for input(s): GLUCAP in the last 168 hours.  Lipid Profile: No results for input(s): CHOL, HDL, LDLCALC, TRIG, CHOLHDL, LDLDIRECT in the last 72 hours. Thyroid Function Tests: No results for input(s): TSH, T4TOTAL, FREET4, T3FREE, THYROIDAB in the last 72 hours. Anemia Panel: No results for input(s): VITAMINB12, FOLATE, FERRITIN, TIBC, IRON, RETICCTPCT in the last 72 hours. Urine analysis: No results found for: COLORURINE, APPEARANCEUR, LABSPEC, PHURINE, GLUCOSEU, HGBUR, BILIRUBINUR, KETONESUR, PROTEINUR, UROBILINOGEN, NITRITE, LEUKOCYTESUR  Creatinine Clearance: CrCl cannot be calculated (Unknown ideal weight.).  Sepsis Labs: (procalcitonin:4,lacticidven:4) )No results found for this or any previous visit (from the past 240 hour(s)).   Radiological Exams on Admission: Dg Chest Port 1 View  Result Date: 07/05/2017 CLINICAL DATA:  Chest pain EXAM: PORTABLE CHEST 1 VIEW COMPARISON:  02/27/2014 FINDINGS: Prior CABG. Heart is normal size. Low lung volumes with bibasilar atelectasis and/or scarring. Otherwise no confluent opacities. No effusions. No acute bony abnormality. IMPRESSION: Bibasilar atelectasis or scarring. Prior CABG. Electronically Signed   By: Charlett Nose M.D.   On: 07/05/2017 11:54    EKG: Sinus rhythm Multiform ventricular premature complexes Prolonged PR interval Right bundle branch  block  Assessment/Plan Principal Problem:   Chest pain Active Problems:   Nausea   CAD (coronary artery disease)   Hyperglycemia   Hypertension   Heart failure (HCC)   #1. Chest pain. Some typical and atypical features.heart score 5. He has a history of MI CAD status post CABG 5 years ago. cardiologist is in [place]. Has not seen cardiologist in 4 years. Initial troponin negative, chest x-ray with by basilar atelectasis, EKG without acute changes. He is provided with nitroglycerin and aspirin at the time of admission is pain-free -Admit to telemetry -Cycle troponin -Serial EKG -We'll obtain an echocardiogram -GI cocktail -when necessary oxygen -When necessary nitroglycerin -continue aspirin -Obtain a lipid panel -cardiology consult  #2. History of heart failure. Unclear as to what type. Care everywhere indicates echo done several years ago with an EF range of 35% to 50%. -will obtain a 2-D echo -monitor daily weights -Monitor intake and output -continue home Lasix -Continue home beta blocker  #3 hypertension. Blood pressure high end of normal. Home medications include amlodipine, Lasix, metoprolol, ramapril -continue home meds -monitor BP  #4. Hyperglycemia. Patient denies history of diabetes. Serum glucose 170 on admission. -Obtain a hemoglobin A1c -Sliding scale insulin for optimal control  #5. CAD status post. See above. Chart review indicates he had cardiac catheterization 2015 revealing nonspecific obstructions and medical management recommended. -see #1 -Cardiology consult requested    DVT prophylaxis: lovnox  Code Status: limited  Family Communication: wife at bedside  Disposition Plan: home  Consults called: cardmaster  Admission status: obs    Gwenyth Bender MD [facility]  If 7PM-7AM, please contact night-coverage [email or web address] Password [facility]  07/05/2017, 3:17 PM

## 2017-07-05 NOTE — ED Provider Notes (Signed)
[facility] EMERGENCY DEPARTMENT Provider Note   CSN: [id number] Arrival date & time: 07/05/17  1118     History   Chief Complaint Chief Complaint  Patient presents with  . Chest Pain  . Nausea  . Emesis    HPI [name] is a 84 y.o. male who presents the emergency department with chief complaint of chest pain.  He has a past history of previous MI and CABG performed at [facility].  Patient was outside getting ready to do some yard work when he had onset of retrosternal chest pain.  He had associated symptoms of presyncope, nausea, diaphoresis.  He took 1 sublingual nitroglycerin and aspirin about 30 minutes prior to arrival of EMS with resolution of his chest pain.  He has had persistent nausea and has vomited twice prior to arrival.  He denies any active pain at this time.  He continues to feel extremely nauseous.  He denies vertiginous symptoms.  Contacts with similar symptoms, ingestion of suspicious foods or eating prior to onset of symptoms.  HPI  Past Medical History:  Diagnosis Date  . CAD (coronary artery disease)   . Chest pain   . Heart failure (HCC)   . Hyperglycemia   . Hypertension     Patient Active Problem List   Diagnosis Date Noted  . Chest pain 07/05/2017  . Nausea 07/05/2017  . CAD (coronary artery disease) 07/05/2017  . Hyperglycemia 07/05/2017  . Hypertension   . Heart failure [facility])     Past Surgical History:  Procedure Laterality Date  . CORONARY ARTERY BYPASS GRAFT     2015        Home Medications    Prior to Admission medications   Medication Sig Start Date End Date Taking? Authorizing Provider  allopurinol (ZYLOPRIM) 100 MG tablet Take 100 mg by mouth daily. 06/24/17  Yes [provider]  amLODipine (NORVASC) 10 MG tablet Take 10 mg by mouth daily. 06/11/17  Yes [provider]  aspirin (ASPIRIN LOW DOSE) 81 MG tablet Take 81 mg by mouth daily.   Yes [provider]  furosemide  (LASIX) 20 MG tablet Take 20 mg by mouth daily. 06/11/17  Yes [provider]  metoprolol tartrate (LOPRESSOR) 25 MG tablet Take 25 mg by mouth 2 (two) times daily. 05/07/17  Yes [provider]  potassium chloride (MICRO-K) 10 MEQ CR capsule Take 10 mEq by mouth daily. 06/20/17  Yes [provider]  ramipril (ALTACE) 10 MG capsule Take 10 mg by mouth daily. 05/26/17  Yes [provider]    Family History Family History  Problem Relation Age of Onset  . Hypertension Mother     Social History Social History   Tobacco Use  . Smoking status: Not on file  Substance Use Topics  . Alcohol use: Not on file  . Drug use: Not on file     Allergies   Patient has no known allergies.   Review of Systems Review of Systems  Ten systems reviewed and are negative for acute change, except as noted in the HPI.   Physical Exam Updated Vital Signs BP 131/69   Pulse 71   Temp (!) 97.4 F (36.3 C) (Oral)   Resp 16   SpO2 97%   Physical Exam  Constitutional: He is oriented to person, place, and time. He appears well-developed and well-nourished. No distress.  Appears ill, eyes closed, seems to be focused internally  HENT:  Head: Normocephalic and atraumatic.  Eyes: Pupils are equal, round, and reactive to light. Conjunctivae and EOM are normal. No scleral icterus.  No nystagmus  Neck: Normal range of motion. Neck supple.  Cardiovascular: Normal rate, regular rhythm, normal heart sounds and normal pulses.  Pulses:      Radial pulses are 2+ on the right side, and 2+ on the left side.  Pulmonary/Chest: Effort normal and breath sounds normal. No respiratory distress.  Abdominal: Soft. There is no tenderness.  Musculoskeletal: He exhibits no edema.       Right lower leg: He exhibits no edema.       Left lower leg: He exhibits no edema.  Neurological: He is alert and oriented to person, place, and time. He displays normal reflexes. No cranial nerve deficit or  sensory deficit. He exhibits normal muscle tone. Coordination normal.  Skin: Skin is warm and dry. He is not diaphoretic.  Psychiatric: His behavior is normal.  Nursing note and vitals reviewed.    ED Treatments / Results  Labs (all labs ordered are listed, but only abnormal results are displayed) Labs Reviewed  CBC WITH DIFFERENTIAL/PLATELET - Abnormal; Notable for the following components:      Result Value   RBC 4.14 (*)    Lymphs Abs 4.3 (*)    Monocytes Absolute 1.1 (*)    All other components within normal limits  COMPREHENSIVE METABOLIC PANEL - Abnormal; Notable for the following components:   Glucose, Bld 170 (*)    GFR calc non Af Amer 55 (*)    All other components within normal limits  LIPASE, BLOOD  URINALYSIS, ROUTINE W REFLEX MICROSCOPIC  LIPID PANEL  HEMOGLOBIN A1C  TROPONIN I  TROPONIN I  TROPONIN I  I-STAT TROPONIN, ED  I-STAT TROPONIN, ED    EKG EKG Interpretation  Date/Time:  Monday July 05 2017 11:22:49 EDT Ventricular Rate:  69 PR Interval:    QRS Duration: 171 QT Interval:  445 QTC Calculation: 477 R Axis:   45 Text Interpretation:  Sinus rhythm Multiform ventricular premature complexes Prolonged PR interval Right bundle branch block No significant change since last tracing Confirmed by Lorre Nick ([id number]) on 07/05/2017 11:40:43 AM   Radiology Dg Chest Port 1 View  Result Date: 07/05/2017 CLINICAL DATA:  Chest pain EXAM: PORTABLE CHEST 1 VIEW COMPARISON:  02/27/2014 FINDINGS: Prior CABG. Heart is normal size. Low lung volumes with bibasilar atelectasis and/or scarring. Otherwise no confluent opacities. No effusions. No acute bony abnormality. IMPRESSION: Bibasilar atelectasis or scarring. Prior CABG. Electronically Signed   By: Charlett Nose M.D.   On: 07/05/2017 11:54    Procedures Procedures (including critical care time)  Medications Ordered in ED Medications  allopurinol (ZYLOPRIM) tablet 100 mg (has no administration in time range)    amLODipine (NORVASC) tablet 10 mg (has no administration in time range)  metoprolol tartrate (LOPRESSOR) tablet 25 mg (has no administration in time range)  ramipril (ALTACE) capsule 10 mg (has no administration in time range)  acetaminophen (TYLENOL) tablet 650 mg (has no administration in time range)  ondansetron (ZOFRAN) injection 4 mg (has no administration in time range)  enoxaparin (LOVENOX) injection 40 mg (has no administration in time range)  morphine 4 MG/ML injection 2 mg (has no administration in time range)  gi cocktail (Maalox,Lidocaine,Donnatal) (has no administration in time range)  aspirin EC tablet 325 mg (has no administration in time range)  furosemide (LASIX) tablet 20 mg (has no administration in time range)  insulin aspart (novoLOG) injection 0-9 Units (has no  administration in time range)  insulin aspart (novoLOG) injection 0-5 Units (has no administration in time range)  prochlorperazine (COMPAZINE) injection 5 mg (5 mg Intravenous Given 07/05/17 1217)  diphenhydrAMINE (BENADRYL) injection 12.5 mg (12.5 mg Intravenous Given 07/05/17 1218)     Initial Impression / Assessment and Plan / ED Course  I have reviewed the triage vital signs and the nursing notes.  Pertinent labs & imaging results that were available during my care of the patient were reviewed by me and considered in my medical decision making (see chart for details).     Patient with high risk chest pain, pain relieved with nitroglycerin prior to arrival.  His lab work and imaging appears excellent at this time and his EKG is reviewed without any ischemic changes.  Patient will need observation admission for serial troponins.  He appears appropriate for admission and stable throughout his ED visit at this time.  Final Clinical Impressions(s) / ED Diagnoses   Final diagnoses:  Essential hypertension  Acute heart failure, unspecified heart failure type [facility])    ED Discharge Orders    None        Arthor Captain, PA-C 07/05/17 1548    Lorre Nick, MD 07/06/17 1314

## 2017-07-05 NOTE — Consult Note (Addendum)
Cardiology Consultation:   Patient ID: [name]; [id number]; 03/22/1930   Admit date: 07/05/2017 Date of Consult: 07/05/2017  Primary Care Provider: Patient, No Pcp Per Primary Cardiologist: Previously Dr. Adrian Blackwater, [facility]   Patient Profile:   [name] is a 84 y.o. male with a hx of CAD S/P CABG 2011, HTN, CHF, syncope  who is being seen today for the evaluation of chest pain at the request of Dr. Ophelia Charter.  History of Present Illness:   [name] was in his usual state of health until this morning. He was out in his garden turning over the soil with a shovel when he developed sudden dizziness followed by central chest pressure that he describes as mild and unlike his prior chest pain prior to his CABG. There was no radiation of his pain and no associated shortness of breath. He then developed nausea and has vomited several times. He was able to walk up to the house and His wife call 911. He took a SL NTG X1.  His pain resolved after a few minutes and has not returned.   He has had no recent illness. He denies recent edema, orthopnea or PND. He works in his garden on most days. He denies any prior exertional chest discomfort or dyspnea. The patient says that he has done well since his cath.   [name] has been followed by Dr. Adrian Blackwater for cardiology in [place]. He was admitted to [facility] in 02/2014 and underwent cardiac cath. I am unable to see those results. He was discharged the next day and had syncopal episode at home.  Notes indicate systolic CHF with a previous EF of 30-35%. Discharge summary from 02/2014 indicates EF on cath was 50%.   Past Medical History:  Diagnosis Date  . CAD (coronary artery disease)   . Chest pain   . Heart failure (HCC)   . Hyperglycemia   . Hypertension     Past Surgical History:  Procedure Laterality Date  . CORONARY ARTERY BYPASS GRAFT     2015     Home Medications:  Prior to  Admission medications   Medication Sig Start Date End Date Taking? Authorizing Provider  allopurinol (ZYLOPRIM) 100 MG tablet Take 100 mg by mouth daily. 06/24/17  Yes [provider]  amLODipine (NORVASC) 10 MG tablet Take 10 mg by mouth daily. 06/11/17  Yes [provider]  aspirin (ASPIRIN LOW DOSE) 81 MG tablet Take 81 mg by mouth daily.   Yes [provider]  furosemide (LASIX) 20 MG tablet Take 20 mg by mouth daily. 06/11/17  Yes [provider]  metoprolol tartrate (LOPRESSOR) 25 MG tablet Take 25 mg by mouth 2 (two) times daily. 05/07/17  Yes [provider]  potassium chloride (MICRO-K) 10 MEQ CR capsule Take 10 mEq by mouth daily. 06/20/17  Yes [provider]  ramipril (ALTACE) 10 MG capsule Take 10 mg by mouth daily. 05/26/17  Yes [provider]    Inpatient Medications: Scheduled Meds: . [START ON 07/06/2017] allopurinol  100 mg Oral Daily  . [START ON 07/06/2017] amLODipine  10 mg Oral Daily  . [START ON 07/06/2017] aspirin EC  325 mg Oral Daily  . enoxaparin (LOVENOX) injection  40 mg Subcutaneous Q24H  . [START ON 07/06/2017] furosemide  20 mg Oral Daily  . insulin aspart  0-5 Units Subcutaneous QHS  . insulin aspart  0-9 Units Subcutaneous TID WC  . metoprolol tartrate  25 mg  Oral BID  . [START ON 07/06/2017] ramipril  10 mg Oral Daily   Continuous Infusions:  PRN Meds: acetaminophen, gi cocktail, morphine injection, ondansetron (ZOFRAN) IV  Allergies:   No Known Allergies  Social History:   Social History   Socioeconomic History  . Marital status: Married    Spouse name: Not on file  . Number of children: Not on file  . Years of education: Not on file  . Highest education level: Not on file  Occupational History  . Not on file  Social Needs  . Financial resource strain: Not on file  . Food insecurity:    Worry: Not on file    Inability: Not on file  . Transportation needs:    Medical: Not on file     Non-medical: Not on file  Tobacco Use  . Smoking status: Not on file  Substance and Sexual Activity  . Alcohol use: Not on file  . Drug use: Not on file  . Sexual activity: Not on file  Lifestyle  . Physical activity:    Days per week: Not on file    Minutes per session: Not on file  . Stress: Not on file  Relationships  . Social connections:    Talks on phone: Not on file    Gets together: Not on file    Attends religious service: Not on file    Active member of club or organization: Not on file    Attends meetings of clubs or organizations: Not on file    Relationship status: Not on file  . Intimate partner violence:    Fear of current or ex partner: Not on file    Emotionally abused: Not on file    Physically abused: Not on file    Forced sexual activity: Not on file  Other Topics Concern  . Not on file  Social History Narrative  . Not on file    Family History:    Family History  Problem Relation Age of Onset  . Hypertension Mother      ROS:  Please see the history of present illness.   All other ROS reviewed and negative.     Physical Exam/Data:   Vitals:   07/05/17 1345 07/05/17 1400 07/05/17 1415 07/05/17 1430  BP: 135/65 (!) 141/61 131/69 133/66  Pulse: 74 73 71 74  Resp: (!) Temp:      TempSrc:      SpO2: 96% 96% 97% 95%   No intake or output data in the 24 hours ending 07/05/17 1725 There were no vitals filed for this visit. There is no height or weight on file to calculate BMI.  General:  Well nourished, well developed, in no acute distress HEENT: normal Lymph: no adenopathy Neck: no JVD Endocrine:  No thryomegaly Vascular: No carotid bruits; FA pulses 2+ bilaterally without bruits  Cardiac:  normal S1, S2; RRR; harsh systolic murmur in apex- audible in left lateral sub axillary region, radiates to carotids Lungs:  clear to auscultation bilaterally, no wheezing, rhonchi or rales  Abd: soft, nontender, no hepatomegaly  Ext: no  edema Musculoskeletal:  No deformities, BUE and BLE strength normal and equal Skin: warm and dry  Neuro:  CNs 2-12 intact, no focal abnormalities noted Psych:  Normal affect   EKG:  The EKG was personally reviewed and demonstrates:  Sinus rhythm, 1st degree AVB, 69 bpm, RBBB, Multiform PVC's   Telemetry:  Telemetry was personally reviewed and demonstrates:  SR 60's-70's with occ multifocal PVC's  Relevant CV Studies:   Laboratory Data:  Chemistry Recent Labs  Lab 07/05/17 1120  NA 138  K 3.9  CL 101  CO2 25  GLUCOSE 170*  BUN 18  CREATININE 1.17  CALCIUM 9.1  GFRNONAA 55*  GFRAA >60  ANIONGAP 12    Recent Labs  Lab 07/05/17 1120  PROT 7.2  ALBUMIN 3.7  AST 36  ALT 36  ALKPHOS 75  BILITOT 0.6   Hematology Recent Labs  Lab 07/05/17 1120  WBC 10.5  RBC 4.14*  HGB 13.6  HCT 39.7  MCV 95.9  MCH 32.9  MCHC 34.3  RDW 13.7  PLT 257   Cardiac EnzymesNo results for input(s): TROPONINI in the last 168 hours.  Recent Labs  Lab 07/05/17 1144 07/05/17 1451  TROPIPOC 0.00 0.00    BNPNo results for input(s): BNP, PROBNP in the last 168 hours.  DDimer No results for input(s): DDIMER in the last 168 hours.  Radiology/Studies:  Dg Chest Port 1 View  Result Date: 07/05/2017 CLINICAL DATA:  Chest pain EXAM: PORTABLE CHEST 1 VIEW COMPARISON:  02/27/2014 FINDINGS: Prior CABG. Heart is normal size. Low lung volumes with bibasilar atelectasis and/or scarring. Otherwise no confluent opacities. No effusions. No acute bony abnormality. IMPRESSION: Bibasilar atelectasis or scarring. Prior CABG. Electronically Signed   By: Charlett Nose M.D.   On: 07/05/2017 11:54    Assessment and Plan:   Chest pain -Brief mild substernal Chest pressure this am while working in the garden. Seemed to occur after her had some dizziness and nausea. No shortness of breath. Resolved with SL NTG after a few minutes. No further chest pain.  -Troponins (POC) 0.00 X2 -EKG without acute T wave  changes, but does have multifocal PVCs -CXR shows bibasilar atelectasis vs scarring.  -Possible vasovagal reaction.  -Would check another troponin and watch overnight. Echo ordered. If troponins remain negative, no changes on echo and no recurrence of chest pain can probably discharge in the morning and plan for outpatient myoview.  -Pt wishes to transfer his care to [facility] in [place].   CAD -Hx CABG 2015 -On aspirin 81 mg daily, BB, ACE-I. Not on statin (LDL 51 on current lipid panel, at goal of <70)  History of chronic systolic CHF -EF ranges 35-50% by records.  -Echo ordered.  -On lasix 20 mg daily at home.  -Pt denies any exertional dyspnea, edema, orthopnea.  -CXR with no pulmonary edema  Hypertension -Home meds include amlodipine 10 mg daily, lopressor 25 mg bid, ramipril 10 mg daily -BP well controlled.   Diabetes type 2 -Followed by PCP. Glucose 170 today.    For questions or updates, please contact [facility] Please consult [email or web address] for contact info under Cardiology/STEMI.   Signed, Berton Bon, NP  07/05/2017 5:25 PM  Patient seen, examined. Available data reviewed. Agree with findings, assessment, and plan as outlined by Lizabeth Leyden, NP. On my exam tonight: Vitals:   07/05/17 1415 07/05/17 1430  BP: 131/69 133/66  Pulse: 71 74  Resp: 16 15  Temp:    SpO2: 97% 95%   Pt is alert and oriented, pleasant elderly male in NAD HEENT: normal Neck: JVP - normal, carotids 2+= without bruits Lungs: CTA bilaterally CV: RRR with distant heart sounds, 2/6 HSM at the apex Abd: soft, NT, Positive BS, no hepatomegaly Ext: no C/C/E, distal pulses intact and equal Skin: warm/dry no rash  EKG with sinus rhythm, RBBB, PVC's, no significant  change from prior tracings.   The patient has known CAD status post CABG.  He underwent cardiac catheterization in 2015 at [facility] with details of that procedure unavailable at this time.   Will send for his previous cardiac catheterization report.  The patient had a fairly brief episode of chest discomfort lasting about 10 minutes today.  It resolved with 1 sublingual nitroglycerin but there were associated symptoms of dizziness, nausea, and vomiting.  There are no acute EKG changes and initial cardiac enzymes are negative.  Would complete cardiac enzymes and if they remain negative, I would consider an outpatient Lexiscan Myoview stress test for further risk stratification.  We will check an echocardiogram to better assess LV function and etiology of his heart murmur.  Otherwise would continue his current medical therapy.  We will follow-up tomorrow after his echocardiogram is completed and his cardiac enzymes have been fully cycled.  Will send for his old cardiac cath report and plan to arrange outpatient follow-up as above.  Tonny Bollman, M.D. 07/05/2017 5:45 PM

## 2017-07-05 NOTE — ED Notes (Signed)
Cardiac team in the room, want RN to hold off on Lovenox to make sure that patient will not have a cath.

## 2017-07-05 NOTE — ED Notes (Signed)
ORDERED DIET TRAY FOR PT  

## 2017-07-06 ENCOUNTER — Observation Stay (HOSPITAL_BASED_OUTPATIENT_CLINIC_OR_DEPARTMENT_OTHER): Payer: Medicare Other

## 2017-07-06 DIAGNOSIS — R072 Precordial pain: Secondary | ICD-10-CM | POA: Diagnosis not present

## 2017-07-06 DIAGNOSIS — I2510 Atherosclerotic heart disease of native coronary artery without angina pectoris: Secondary | ICD-10-CM | POA: Diagnosis not present

## 2017-07-06 DIAGNOSIS — R079 Chest pain, unspecified: Secondary | ICD-10-CM

## 2017-07-06 DIAGNOSIS — Z951 Presence of aortocoronary bypass graft: Secondary | ICD-10-CM | POA: Diagnosis not present

## 2017-07-06 DIAGNOSIS — R55 Syncope and collapse: Secondary | ICD-10-CM | POA: Diagnosis not present

## 2017-07-06 LAB — ECHOCARDIOGRAM COMPLETE
Height: 70 in
WEIGHTICAEL: 2906.54 [oz_av]

## 2017-07-06 LAB — HEMOGLOBIN A1C
HEMOGLOBIN A1C: 5.3 % (ref 4.8–5.6)
MEAN PLASMA GLUCOSE: 105.41 mg/dL

## 2017-07-06 LAB — TROPONIN I: Troponin I: <0.03 ng/mL (ref <0.03)

## 2017-07-06 LAB — GLUCOSE, CAPILLARY
GLUCOSE-CAPILLARY: 108 mg/dL — AB (ref 65–99)
Glucose-Capillary: 96 mg/dL (ref 65–99)

## 2017-07-06 MED ORDER — ASPIRIN 325 MG PO TBEC: 325.0000 mg | DELAYED_RELEASE_TABLET | Freq: Every day | ORAL | 0 refills | Status: DC

## 2017-07-06 NOTE — Progress Notes (Addendum)
Progress Note  Patient Name: [name] [id number] Date of Encounter: 07/06/2017  Primary Cardiologist: No primary care provider on file.  Subjective   Feeling well this morning. No further chest pain.   Inpatient Medications    Scheduled Meds: . allopurinol  100 mg Oral Daily  . amLODipine  10 mg Oral Daily  . aspirin EC  325 mg Oral Daily  . enoxaparin (LOVENOX) injection  40 mg Subcutaneous Q24H  . furosemide  20 mg Oral Daily  . insulin aspart  0-5 Units Subcutaneous QHS  . insulin aspart  0-9 Units Subcutaneous TID WC  . metoprolol tartrate  25 mg Oral BID  . ramipril  10 mg Oral Daily   Continuous Infusions:  PRN Meds: acetaminophen, gi cocktail, morphine injection, ondansetron (ZOFRAN) IV   Vital Signs    Vitals:   07/05/17 2000 07/05/17 2030 07/05/17 2142 07/06/17 0530  BP:  131/61 (!) 138/59 (!) 123/54  Pulse: 71  71 63  Resp: Temp:   98.3 F (36.8 C) 99 F (37.2 C)  TempSrc:      SpO2: 96%  95% 97%  Weight:   183 lb 10.3 oz (83.3 kg) 181 lb 10.5 oz (82.4 kg)  Height:    (1.778 m)     Intake/Output Summary (Last 24 hours) at 07/06/2017 1200 Last data filed at 07/06/2017 1021 Gross per 24 hour  Intake 0 ml  Output 900 ml  Net -900 ml   Filed Weights   07/05/17 2142 07/06/17 0530  Weight: 183 lb 10.3 oz (83.3 kg) 181 lb 10.5 oz (82.4 kg)    Telemetry    1st degree AVB, with PVCs - Personally Reviewed  ECG    1st degree AVB with PVCs - Personally Reviewed  Physical Exam   General: Well developed, well nourished, male appearing in no acute distress. Head: Normocephalic, atraumatic.  Neck: Supple without bruits, JVD. Lungs:  Resp regular and unlabored, CTA. Heart: RRR, S1, S2, no S3, S4, or murmur; no rub. Abdomen: Soft, non-tender, non-distended with normoactive bowel sounds. Extremities: No clubbing, cyanosis, edema. Distal pedal pulses are 2+ bilaterally. Neuro: Alert and oriented X 3. Moves all extremities  spontaneously. Psych: Normal affect.  Labs    Chemistry Recent Labs  Lab 07/05/17 1120  NA 138  K 3.9  CL 101  CO2 25  GLUCOSE 170*  BUN 18  CREATININE 1.17  CALCIUM 9.1  PROT 7.2  ALBUMIN 3.7  AST 36  ALT 36  ALKPHOS 75  BILITOT 0.6  GFRNONAA 55*  GFRAA >60  ANIONGAP 12     Hematology Recent Labs  Lab 07/05/17 1120  WBC 10.5  RBC 4.14*  HGB 13.6  HCT 39.7  MCV 95.9  MCH 32.9  MCHC 34.3  RDW 13.7  PLT 257    Cardiac Enzymes Recent Labs  Lab 07/05/17 1633 07/05/17 2128 07/06/17 0357  TROPONINI <0.03 <0.03 <0.03    Recent Labs  Lab 07/05/17 1144 07/05/17 1451  TROPIPOC 0.00 0.00     BNPNo results for input(s): BNP, PROBNP in the last 168 hours.   DDimer No results for input(s): DDIMER in the last 168 hours.    Radiology    Dg Chest Port 1 View  Result Date: 07/05/2017 CLINICAL DATA:  Chest pain EXAM: PORTABLE CHEST 1 VIEW COMPARISON:  02/27/2014 FINDINGS: Prior CABG. Heart is normal size. Low lung volumes with bibasilar atelectasis and/or scarring. Otherwise no confluent opacities. No effusions. No acute  bony abnormality. IMPRESSION: Bibasilar atelectasis or scarring. Prior CABG. Electronically Signed   By: Charlett Nose M.D.   On: 07/05/2017 11:54    Cardiac Studies   TTE: pending   Patient Profile     84 y.o. male with a hx of CAD S/P CABG 2011, HTN, CHF, syncope who presented with chest pain.   Assessment & Plan    1. Chest pain: Brief mild substernal Chest pressure yesterday morning while working in the garden. Seemed to occur after her had some dizziness and nausea. No shortness of breath. Resolved with SL NTG after a few minutes. No further chest pain. Troponins neg x3. No further episodes of pain. EKG without acute T wave changes, but does have multifocal PVCs  -Pt wishes to transfer his care to [facility] in [place]. Will arrange follow up and consider outpatient myoview at that time.   2. CAD: Hx CABG 2015. On aspirin   daily (would reduce to ), BB, ACE-I. Not on statin (LDL 51 on current lipid panel, at goal of <70)  3. History of chronic systolic CHF: EF ranges 35-50% by records.  -Echo ordered, read pending.  -On lasix 20 mg daily at home.   4. Hypertension -Home meds include amlodipine 10 mg daily, lopressor 25 mg bid, ramipril 10 mg daily -BP well controlled.   5. Diabetes type 2 -Followed by PCP.   Signed, Laverda Page, NP  07/06/2017, 12:00 PM  Pager # [phone]   For questions or updates, please contact [facility] Please consult [email or web address] for contact info under Cardiology/STEMI.  I have personally seen and examined this patient. I agree with the assessment and plan as outlined above. He has no objective evidence of ischemia. Troponin negative. No further chest pain. Echo by my preliminary read with low normal LV function. I think he can be discharged home today. We will plan an outpatient nuclear stress test and f/u in our [place] office.   Verne Carrow 07/06/2017 12:29 PM

## 2017-07-06 NOTE — Care Management Obs Status (Signed)
MEDICARE OBSERVATION STATUS NOTIFICATION   Patient Details  Name: [name] MRN: [id number] Date of Birth: Dec 16, 1930   Medicare Observation Status Notification Given:  Yes    Lawerance Sabal, RN 07/06/2017, 10:49 AM

## 2017-07-06 NOTE — Progress Notes (Signed)
  Echocardiogram 2D Echocardiogram has been performed.  Sumie Remsen L Androw 07/06/2017, 8:59 AM

## 2017-07-06 NOTE — Discharge Summary (Signed)
Physician Discharge Summary  [name] ZOX:[id number] DOB: 11-01-1930 DOA: 07/05/2017  PCP: Patient, No Pcp Per  Admit date: 07/05/2017 Discharge date: 07/06/2017  Admitted From: Home Disposition: Home  Recommendations for Outpatient Follow-up:  1. Follow up with PCP in 1-2 weeks 2. Please obtain BMP/CBC in one week Home Health none Equipment/Devices: None Discharge Condition: Stable CODE STATUS:  partial code Diet recommendation: Cardiac  Brief/Interim Summary 84 y.o. male with medical history significant for CAD status post CABG 5 years ago, hypertension,syncope, congestive heart failure systolic dysfunction ejection fraction 35-30% presents to emergency Department chief complaint of chest pain. [facility] are asked to admit for chest pain rule out  Information is obtained from the patient and his wife is at the bedside. He was his usual state of health until this morning he went outside and started bleeding in the garden. He states he walked out to the garden carrying his several and he had been taking for about 1 minute he experienced sudden sharp left anterior chest pain.he denies radiation of this pain. Associated symptoms include nausea with vomiting diaphoresis dizziness. He states he walked back into the kitchen sat down and took one sublingual nitroglycerin and an aspirin while his wife called EMS. The time EMS arrived chest pain had resolved. He has experienced persistent nausea and has vomited twice. No report of any coffee ground emesis. He denies abdominal pain dysuria hematuria frequency or urgency. He denies fever chills coughheadache lower extremity edema or orthopnea. He denies diarrhea constipation melena bright red blood per rectum. He does not believe he seen his cardiologist in almost 4 years.  :  Discharge Diagnoses:  Principal Problem:   Chest pain Active Problems:   Nausea   CAD (coronary artery disease)   Hyperglycemia   Hypertension   Heart  failure (HCC)  1] chest pain patient admitted with substernal chest pressure resolved with nitroglycerin.  Cardiac work-up showed negative troponins.  No acute EKG changes.  She was seen by cardiology in consult.  They are planning to do an outpatient Myoview at [facility] in [place].  Cheree Ditto shows normal left ventricular systolic function mild diastolic dysfunction trace AI, ejection fraction 55 to 60%.  2] hypertension his blood pressure has been soft here I will discharge him on Lasix and ACE inhibitor.  I will DC his Norvasc.  Discharge Instructions  Discharge Instructions    Call MD for:  difficulty breathing, headache or visual disturbances   Complete by:  As directed    Diet - low sodium heart healthy   Complete by:  As directed    Increase activity slowly   Complete by:  As directed      Allergies as of 07/06/2017   No Known Allergies     Medication List    STOP taking these medications   amLODipine 10 MG tablet Commonly known as:  NORVASC   ASPIRIN LOW DOSE 81 MG tablet Generic drug:  aspirin Replaced by:  aspirin 325 MG EC tablet     TAKE these medications   allopurinol 100 MG tablet Commonly known as:  ZYLOPRIM Take 100 mg by mouth daily.   aspirin 325 MG EC tablet Take 1 tablet (325 mg total) by mouth daily. Start taking on:  07/07/2017 Replaces:  ASPIRIN LOW DOSE 81 MG tablet   furosemide 20 MG tablet Commonly known as:  LASIX Take 20 mg by mouth daily.   metoprolol tartrate 25 MG tablet Commonly known as:  LOPRESSOR Take 25 mg by  mouth 2 (two) times daily.   potassium chloride 10 MEQ CR capsule Commonly known as:  MICRO-K Take 10 mEq by mouth daily.   ramipril 10 MG capsule Commonly known as:  ALTACE Take 10 mg by mouth daily.      Follow-up Information    End, Cristal Deer, MD Follow up on 08/05/2017.   Specialty:  Cardiology Why:  at 10am for your follow up appt.  Contact information: [address] [place]  [address] [phone]          No Known Allergies  Consultations:[facility]  Procedures/Studies: Dg Chest Port 1 View  Result Date: 07/05/2017 CLINICAL DATA:  Chest pain EXAM: PORTABLE CHEST 1 VIEW COMPARISON:  02/27/2014 FINDINGS: Prior CABG. Heart is normal size. Low lung volumes with bibasilar atelectasis and/or scarring. Otherwise no confluent opacities. No effusions. No acute bony abnormality. IMPRESSION: Bibasilar atelectasis or scarring. Prior CABG. Electronically Signed   By: Charlett Nose M.D.   On: 07/05/2017 11:54    (Echo, Carotid, EGD, Colonoscopy, ERCP)    Subjective:   Discharge Exam: Vitals:   07/06/17 0530 07/06/17 1428  BP: (!) 123/54 127/64  Pulse: 63 66  Resp: 17 18  Temp: 99 F (37.2 C) 98.1 F (36.7 C)  SpO2: 97% 98%   Vitals:   07/05/17 2030 07/05/17 2142 07/06/17 0530 07/06/17 1428  BP: 131/61 (!) 138/59 (!) 123/54 127/64  Pulse:  71 63 66  Resp:  Temp:  98.3 F (36.8 C) 99 F (37.2 C) 98.1 F (36.7 C)  TempSrc:    Oral  SpO2:  95% 97% 98%  Weight:  83.3 kg (183 lb 10.3 oz) 82.4 kg (181 lb 10.5 oz)   Height:   (1.778 m)      General: Pt is alert, awake, not in acute distress Cardiovascular: RRR, S1/S2 +, no rubs, no gallops Respiratory: CTA bilaterally, no wheezing, no rhonchi Abdominal: Soft, NT, ND, bowel sounds + Extremities: no edema, no cyanosis    The results of significant diagnostics from this hospitalization (including imaging, microbiology, ancillary and laboratory) are listed below for reference.     Microbiology: No results found for this or any previous visit (from the past 240 hour(s)).   Labs: BNP (last 3 results) No results for input(s): BNP in the last 8760 hours. Basic Metabolic Panel: Recent Labs  Lab 07/05/17 1120  NA 138  K 3.9  CL 101  CO2 25  GLUCOSE 170*  BUN 18  CREATININE 1.17  CALCIUM 9.1   Liver Function Tests: Recent Labs  Lab 07/05/17 1120  AST 36  ALT 36  ALKPHOS 75   BILITOT 0.6  PROT 7.2  ALBUMIN 3.7   Recent Labs  Lab 07/05/17 1120  LIPASE 27   No results for input(s): AMMONIA in the last 168 hours. CBC: Recent Labs  Lab 07/05/17 1120  WBC 10.5  NEUTROABS 4.7  HGB 13.6  HCT 39.7  MCV 95.9  PLT 257   Cardiac Enzymes: Recent Labs  Lab 07/05/17 1633 07/05/17 2128 07/06/17 0357  TROPONINI <0.03 <0.03 <0.03   BNP: Invalid input(s): POCBNP CBG: Recent Labs  Lab 07/05/17 2141 07/06/17 0802 07/06/17 1207  GLUCAP 115* 96 108*   D-Dimer No results for input(s): DDIMER in the last 72 hours. Hgb A1c Recent Labs    07/05/17 1120  HGBA1C 5.3   Lipid Profile Recent Labs    07/05/17 1120  CHOL 128  HDL 23*  LDLCALC 51  TRIG 270*  CHOLHDL 5.6   Thyroid function studies Recent Labs    07/05/17 1902  TSH 1.404   Anemia work up No results for input(s): VITAMINB12, FOLATE, FERRITIN, TIBC, IRON, RETICCTPCT in the last 72 hours. Urinalysis    Component Value Date/Time   COLORURINE YELLOW 07/05/2017 1633   APPEARANCEUR CLEAR 07/05/2017 1633   LABSPEC 1.014 07/05/2017 1633   PHURINE 5.0 07/05/2017 1633   GLUCOSEU NEGATIVE 07/05/2017 1633   HGBUR NEGATIVE 07/05/2017 1633   BILIRUBINUR NEGATIVE 07/05/2017 1633   KETONESUR NEGATIVE 07/05/2017 1633   PROTEINUR NEGATIVE 07/05/2017 1633   NITRITE NEGATIVE 07/05/2017 1633   LEUKOCYTESUR NEGATIVE 07/05/2017 1633   Sepsis Labs Invalid input(s): PROCALCITONIN,  WBC,  LACTICIDVEN Microbiology No results found for this or any previous visit (from the past 240 hour(s)).   Time coordinating discharge: Over 33 minutes  SIGNED:   Alwyn Ren, MD  [facility] 07/06/2017, 3:39 PM Pager   If 7PM-7AM, please contact night-coverage [email or web address] Password [email or web address]

## 2017-07-06 NOTE — Progress Notes (Signed)
Patient arrived to the unit via bed from 5 central.  Patient is alert and oriented.  No complaints of chest pain.  Skin assessment complete.  No skin issues.  Placed the patient on cardiac monitoring.  Educated the patient on how to reach the staff on the unit.  Lowered the bed and placed the call light within reach.  Will continue to monitor the patient and notify as needed

## 2017-08-05 ENCOUNTER — Encounter: Payer: Self-pay | Admitting: Internal Medicine

## 2017-08-05 ENCOUNTER — Ambulatory Visit: Payer: Medicare Other | Admitting: Internal Medicine

## 2017-08-05 VITALS — BP 176/78 | HR 55 | Ht 69.0 in | Wt 187.0 lb

## 2017-08-05 DIAGNOSIS — Z79899 Other long term (current) drug therapy: Secondary | ICD-10-CM

## 2017-08-05 DIAGNOSIS — I10 Essential (primary) hypertension: Secondary | ICD-10-CM

## 2017-08-05 DIAGNOSIS — I25119 Atherosclerotic heart disease of native coronary artery with unspecified angina pectoris: Secondary | ICD-10-CM

## 2017-08-05 MED ORDER — ASPIRIN EC 81 MG PO TBEC: 81.0000 mg | DELAYED_RELEASE_TABLET | Freq: Every day | ORAL | 3 refills | Status: AC

## 2017-08-05 MED ORDER — RAMIPRIL 10 MG PO CAPS: 10.0000 mg | ORAL_CAPSULE | Freq: Two times a day (BID) | ORAL | 2 refills | Status: DC

## 2017-08-05 NOTE — Patient Instructions (Addendum)
Medication Instructions:  Your physician has recommended you make the following change in your medication:  1- DECREASE Aspirin to 81 mg by mouth once a day. 2- INCREASE Ramipril to 10 mg by mouth two times a day.   Labwork: Your physician recommends that you return for lab work in: 2 WEEKS FOR BMET ALONG WITH BLOOD PRESSURE CHECK IN THE OFFICE.   Testing/Procedures: Your physician has requested that you have a lexiscan myoview. For further information please visit [email or web address] Please follow instruction sheet, as given.  [facility] MYOVIEW  Your caregiver has ordered a Stress Test with nuclear imaging. The purpose of this test is to evaluate the blood supply to your heart muscle. This procedure is referred to as a "Non-Invasive Stress Test." This is because other than having an IV started in your vein, nothing is inserted or "invades" your body. Cardiac stress tests are done to find areas of poor blood flow to the heart by determining the extent of coronary artery disease (CAD). Some patients exercise on a treadmill, which naturally increases the blood flow to your heart, while others who are  unable to walk on a treadmill due to physical limitations have a pharmacologic/chemical stress agent called Lexiscan . This medicine will mimic walking on a treadmill by temporarily increasing your coronary blood flow.   Please note: these test may take anywhere between 2-4 hours to complete  PLEASE REPORT TO [facility] MEDICAL MALL ENTRANCE  THE VOLUNTEERS AT THE FIRST DESK WILL DIRECT YOU WHERE TO GO  Date of Procedure:_____________________________________  Arrival Time for Procedure:______________________________  Instructions regarding medication:   _XX_:  Hold other medications as follows:_______FUROSEMIDE____________    PLEASE NOTIFY THE OFFICE AT LEAST 24 HOURS IN ADVANCE IF YOU ARE UNABLE TO KEEP YOUR APPOINTMENT.  [phone] AND  PLEASE NOTIFY NUCLEAR MEDICINE AT [facility] AT LEAST 24 HOURS  IN ADVANCE IF YOU ARE UNABLE TO KEEP YOUR APPOINTMENT. [phone]  How to prepare for your Myoview test:  1. Do not eat or drink after midnight 2. No caffeine for 24 hours prior to test 3. No smoking 24 hours prior to test. 4. Your medication may be taken with water.  If your doctor stopped a medication because of this test, do not take that medication. 5. Ladies, please do not wear dresses.  Skirts or pants are appropriate. Please wear a short sleeve shirt. 6. No perfume, cologne or lotion. 7. Wear comfortable walking shoes. No heels!    Follow-Up: Your physician recommends that you schedule a follow-up appointment in: 2 WEEKS FOR BLOOD PRESSURE CHECK.  Your physician recommends that you schedule a follow-up appointment in: 3 MONTHS WITH DR END OR APP.  If you need a refill on your cardiac medications before your next appointment, please call your pharmacy.    Cardiac Nuclear Scan A cardiac nuclear scan is a test that measures blood flow to the heart when a person is resting and when he or she is exercising. The test looks for problems such as:  Not enough blood reaching a portion of the heart.  The heart muscle not working normally.  You may need this test if:  You have heart disease.  You have had abnormal lab results.  You have had heart surgery or angioplasty.  You have chest pain.  You have shortness of breath.  In this test, a radioactive dye (tracer) is injected into your bloodstream. After the tracer has traveled to your heart, an imaging device is used to measure how much of the  tracer is absorbed by or distributed to various areas of your heart. This procedure is usually done at a hospital and takes 2-4 hours. Tell a health care provider about:  Any allergies you have.  All medicines you are taking, including vitamins, herbs, eye drops, creams, and over-the-counter medicines.  Any problems you or family members have had with the use of anesthetic  medicines.  Any blood disorders you have.  Any surgeries you have had.  Any medical conditions you have.  Whether you are pregnant or may be pregnant. What are the risks? Generally, this is a safe procedure. However, problems may occur, including:  Serious chest pain and heart attack. This is only a risk if the stress portion of the test is done.  Rapid heartbeat.  Sensation of warmth in your chest. This usually passes quickly.  What happens before the procedure?  Ask your health care provider about changing or stopping your regular medicines. This is especially important if you are taking diabetes medicines or blood thinners.  Remove your jewelry on the day of the procedure. What happens during the procedure?  An IV tube will be inserted into one of your veins.  Your health care provider will inject a small amount of radioactive tracer through the tube.  You will wait for 20-40 minutes while the tracer travels through your bloodstream.  Your heart activity will be monitored with an electrocardiogram (ECG).  You will lie down on an exam table.  Images of your heart will be taken for about 15-20 minutes.  You may be asked to exercise on a treadmill or stationary bike. While you exercise, your heart's activity will be monitored with an ECG, and your blood pressure will be checked. If you are unable to exercise, you may be given a medicine to increase blood flow to parts of your heart.  When blood flow to your heart has peaked, a tracer will again be injected through the IV tube.  After 20-40 minutes, you will get back on the exam table and have more images taken of your heart.  When the procedure is over, your IV tube will be removed. The procedure may vary among health care providers and hospitals. Depending on the type of tracer used, scans may need to be repeated 3-4 hours later. What happens after the procedure?  Unless your health care provider tells you otherwise,  you may return to your normal schedule, including diet, activities, and medicines.  Unless your health care provider tells you otherwise, you may increase your fluid intake. This will help flush the contrast dye from your body. Drink enough fluid to keep your urine clear or pale yellow.  It is up to you to get your test results. Ask your health care provider, or the department that is doing the test, when your results will be ready. Summary  A cardiac nuclear scan measures the blood flow to the heart when a person is resting and when he or she is exercising.  You may need this test if you are at risk for heart disease.  Tell your health care provider if you are pregnant.  Unless your health care provider tells you otherwise, increase your fluid intake. This will help flush the contrast dye from your body. Drink enough fluid to keep your urine clear or pale yellow. This information is not intended to replace advice given to you by your health care provider. Make sure you discuss any questions you have with your health care provider. Document  Released: 03/20/2004 Document Revised: 02/26/2016 Document Reviewed: 02/01/2013 Elsevier Interactive Patient Education  2017 [employer].

## 2017-08-05 NOTE — Progress Notes (Signed)
Follow-up Outpatient Visit Date: 08/05/2017  Primary Care Provider: Patient, No Pcp Per No address on file  Chief Complaint: Follow-up chest pain and history of coronary artery disease  HPI:  [name] is a 84 y.o. year-old male with history of coronary artery disease status post CABG (2013), hypertension, systolic heart failure, and syncope, who presents for follow-up of chest pain.  He was admitted at [facility] in late April due to chest pain.  Work-up was unrevealing, with negative troponins and echocardiogram demonstrating preserved LVEF with grade 1 diastolic dysfunction.  No further inpatient ischemia evaluation was pursued.  [name] was previously followed by Dr. Welton Flakes, but has transitioned his care to our office.  Today, [name] reports feeling well.  He has not had any further episodes of chest pain or shortness of breath since his hospitalization in April.  At the time, he had been hoeing in his yard and began feeling unwell.  He became nauseated and vomited at least twice with accompanying lower chest discomfort.  By the time he presented to the hospital, his chest pain was resolving.  He notes that prior to his CABG in 2013, he had more persistent chest pain, which she has not experienced recently.  He denies shortness of breath, palpitations, lightheadedness, or edema.  --------------------------------------------------------------------------------------------------  Past Medical History:  Diagnosis Date  . CAD (coronary artery disease)   . Chest pain   . Heart failure (HCC)   . Hyperglycemia   . Hypertension    Past Surgical History:  Procedure Laterality Date  . CORONARY ARTERY BYPASS GRAFT     2015   Recent CV Pertinent Labs: Lab Results  Component Value Date   CHOL 128 07/05/2017   CHOL 146 05/21/2011   HDL 23 (L) 07/05/2017   HDL 21 (L) 05/21/2011   LDLCALC 51 07/05/2017   LDLCALC 81 05/21/2011   TRIG 270 (H) 07/05/2017   TRIG 219 (H)  05/21/2011   CHOLHDL 5.6 07/05/2017   INR 1.1 05/21/2011   K 3.9 07/05/2017   K 3.6 02/27/2014   BUN 18 07/05/2017   BUN 19 (H) 02/27/2014   CREATININE 1.17 07/05/2017   CREATININE 1.26 02/27/2014    Past medical and surgical history were reviewed and updated in EPIC.  Current Meds  Medication Sig  . allopurinol (ZYLOPRIM) 100 MG tablet Take 100 mg by mouth daily.  Marland Kitchen aspirin EC 325 MG EC tablet Take 1 tablet (325 mg total) by mouth daily.  . furosemide (LASIX) 20 MG tablet Take 20 mg by mouth daily.  . metoprolol tartrate (LOPRESSOR) 25 MG tablet Take 25 mg by mouth 2 (two) times daily.  . potassium chloride (MICRO-K) 10 MEQ CR capsule Take 10 mEq by mouth daily.  . ramipril (ALTACE) 10 MG capsule Take 10 mg by mouth daily.    Allergies: Patient has no known allergies.  Social History   Tobacco Use  . Smoking status: Former Games developer  . Smokeless tobacco: Never Used  . Tobacco comment: Quit in 1990  Substance Use Topics  . Alcohol use: Not on file  . Drug use: Never    Family History  Problem Relation Age of Onset  . Hypertension Mother     Review of Systems: A 12-system review of systems was performed and was negative except as noted in the HPI.  --------------------------------------------------------------------------------------------------  Physical Exam: BP (!) 176/78 (BP Location: Right Arm, Patient Position: Sitting, Cuff Size: Normal)   Pulse (!) 55   Ht  (  1.753 m)   Wt 187 lb (84.8 kg)   BMI 27.62 kg/m   General: NAD. HEENT: No conjunctival pallor or scleral icterus. Moist mucous membranes.  OP clear. Neck: Supple without lymphadenopathy, thyromegaly, JVD, or HJR. Lungs: Normal work of breathing. Clear to auscultation bilaterally without wheezes or crackles. Heart: Regular rate and rhythm without murmurs, rubs, or gallops. Non-displaced PMI. Abd: Bowel sounds present. Soft, NT/ND without hepatosplenomegaly Ext: Trace pretibial edema  bilaterally. Skin: Warm and dry without rash.  EKG: Normal sinus rhythm with first-degree AV block and right bundle branch block.  Lab Results  Component Value Date   WBC 10.5 07/05/2017   HGB 13.6 07/05/2017   HCT 39.7 07/05/2017   MCV 95.9 07/05/2017   PLT 257 07/05/2017    Lab Results  Component Value Date   NA 138 07/05/2017   K 3.9 07/05/2017   CL 101 07/05/2017   CO2 25 07/05/2017   BUN 18 07/05/2017   CREATININE 1.17 07/05/2017   GLUCOSE 170 (H) 07/05/2017   ALT 36 07/05/2017    Lab Results  Component Value Date   CHOL 128 07/05/2017   HDL 23 (L) 07/05/2017   LDLCALC 51 07/05/2017   TRIG 270 (H) 07/05/2017   CHOLHDL 5.6 07/05/2017    --------------------------------------------------------------------------------------------------  ASSESSMENT AND PLAN: Coronary artery disease with atypical angina Overall, [name] has done well, though he was admitted with an episode of chest pain that occurred while hoeing in his yard in April.  Work-up at that time was unrevealing.  However, given his history of CAD and CABG, we have agreed to pursue further evaluation with a pharmacologic myocardial perfusion stress test.  As long as this does not show any high risk features, we will continue with medical therapy.  Continue low-dose aspirin.  Given LDL of 51 in April, I will defer adding a statin at this time in light of the patient's advanced age.  We can readdress this when he returns for follow-up.  Hypertension Blood pressure suboptimally controlled today.  We will increase ramipril to 10 mg twice daily, with basic metabolic panel and blood pressure check in our office in about 2 weeks.  Follow-up: Return to clinic in 3 months.  Yvonne Kendall, MD 08/05/2017 11:18 AM

## 2017-08-06 ENCOUNTER — Encounter: Payer: Self-pay | Admitting: Internal Medicine

## 2017-08-10 ENCOUNTER — Ambulatory Visit
Admission: RE | Admit: 2017-08-10 | Discharge: 2017-08-10 | Disposition: A | Discharge status: Home / Self Care | Payer: Medicare Other | Source: Ambulatory Visit | Attending: Internal Medicine | Admitting: Internal Medicine

## 2017-08-10 DIAGNOSIS — I25119 Atherosclerotic heart disease of native coronary artery with unspecified angina pectoris: Secondary | ICD-10-CM

## 2017-08-10 LAB — NM MYOCAR MULTI W/SPECT W/WALL MOTION / EF
CHL CUP RESTING HR STRESS: 56 {beats}/min
CSEPEW: 1 METS
CSEPPHR: 83 {beats}/min
Exercise duration (min): 0 min
Exercise duration (sec): 0 s
LV dias vol: 65 mL (ref 62–150)
LV sys vol: 21 mL
TID: 0.84

## 2017-08-10 MED ORDER — TECHNETIUM TC 99M TETROFOSMIN IV KIT
13.3100 | PACK | Freq: Once | INTRAVENOUS | Status: AC | PRN
  Administered 2017-08-10: 13.31 via INTRAVENOUS

## 2017-08-10 MED ORDER — TECHNETIUM TC 99M TETROFOSMIN IV KIT
32.8400 | PACK | Freq: Once | INTRAVENOUS | Status: AC | PRN
  Administered 2017-08-10: 32.84 via INTRAVENOUS

## 2017-08-10 MED ORDER — REGADENOSON 0.4 MG/5ML IV SOLN
0.4000 mg | Freq: Once | INTRAVENOUS | Status: AC
  Administered 2017-08-10: 0.4 mg via INTRAVENOUS

## 2017-08-12 ENCOUNTER — Telehealth: Payer: Self-pay | Admitting: Internal Medicine

## 2017-08-12 NOTE — Telephone Encounter (Signed)
No answer. Left message to call back.   

## 2017-08-12 NOTE — Telephone Encounter (Signed)
Patient returning call for results  States best number to contact is [phone]

## 2017-08-16 NOTE — Telephone Encounter (Signed)
Results called to pt. Pt verbalized understanding of results and plan of care to f/u on 08/19/17 for lab work and BP check in the office.

## 2017-08-19 ENCOUNTER — Other Ambulatory Visit (INDEPENDENT_AMBULATORY_CARE_PROVIDER_SITE_OTHER): Payer: Medicare Other

## 2017-08-19 ENCOUNTER — Ambulatory Visit (INDEPENDENT_AMBULATORY_CARE_PROVIDER_SITE_OTHER): Payer: Medicare Other | Admitting: *Deleted

## 2017-08-19 VITALS — BP 132/82 | HR 58 | Resp 19 | Ht 69.0 in | Wt 186.5 lb

## 2017-08-19 DIAGNOSIS — I25119 Atherosclerotic heart disease of native coronary artery with unspecified angina pectoris: Secondary | ICD-10-CM | POA: Diagnosis not present

## 2017-08-19 DIAGNOSIS — Z79899 Other long term (current) drug therapy: Secondary | ICD-10-CM

## 2017-08-19 DIAGNOSIS — I10 Essential (primary) hypertension: Secondary | ICD-10-CM

## 2017-08-19 NOTE — Progress Notes (Signed)
Blood pressure under better control today.  Continue current medications.  We will follow-up with him after BMP has resulted.  Yvonne Kendallhristopher Kemond Amorin, MD [facility] Pager: [phone]

## 2017-08-19 NOTE — Progress Notes (Signed)
1.) Reason for visit: BP check and labs  2.) Name of MD requesting visit: Dr. Okey DupreEnd  3.) H&P: Hypertension, CAD, Heart Failure, hyperglycemia  4.) ROS related to problem: Patient in today and reports that he has been monitoring blood pressures at home with ranges in the 130's over 80's and heart rates in the mid 50's. He denies any chest pain or shortness of breat today and is on his way to a [employer] singing.  5.) Assessment and plan per MD: Today blood pressure was 132/82, weight was 186 lb 8 oz, and he had no complaints today. Instructed him to continue monitoring his blood pressures and to call us if they should persistently run 140/90 or higher. He verbalized understanding with no further questions.

## 2017-08-20 LAB — BASIC METABOLIC PANEL
BUN / CREAT RATIO: 15 (ref 10–24)
BUN: 14 mg/dL (ref 8–27)
CO2: 26 mmol/L (ref 20–29)
Calcium: 9.4 mg/dL (ref 8.6–10.2)
Chloride: 98 mmol/L (ref 96–106)
Creatinine, Ser: 0.95 mg/dL (ref 0.76–1.27)
GFR, EST AFRICAN AMERICAN: 84 mL/min/{1.73_m2} (ref >59)
GFR, EST NON AFRICAN AMERICAN: 73 mL/min/{1.73_m2} (ref >59)
Glucose: 110 mg/dL — ABNORMAL HIGH (ref 65–99)
POTASSIUM: 4.7 mmol/L (ref 3.5–5.2)
SODIUM: 137 mmol/L (ref 134–144)

## 2017-08-24 ENCOUNTER — Encounter: Payer: Self-pay | Admitting: Internal Medicine

## 2017-09-21 ENCOUNTER — Encounter: Payer: Self-pay | Admitting: Internal Medicine

## 2017-11-04 ENCOUNTER — Ambulatory Visit: Payer: Medicare Other | Admitting: Nurse Practitioner

## 2017-11-04 ENCOUNTER — Encounter: Payer: Self-pay | Admitting: Nurse Practitioner

## 2017-11-04 VITALS — BP 194/86 | HR 69 | Ht 69.0 in | Wt 191.2 lb

## 2017-11-04 DIAGNOSIS — I2510 Atherosclerotic heart disease of native coronary artery without angina pectoris: Secondary | ICD-10-CM

## 2017-11-04 DIAGNOSIS — I10 Essential (primary) hypertension: Secondary | ICD-10-CM

## 2017-11-04 MED ORDER — AMLODIPINE BESYLATE 5 MG PO TABS: 5.0000 mg | ORAL_TABLET | Freq: Every day | ORAL | 3 refills | Status: DC

## 2017-11-04 NOTE — Patient Instructions (Signed)
Medication Instructions:  Your physician has recommended you make the following change in your medication:  1- START Amlodipine 5 mg by mouth once a day.   Labwork: none  Testing/Procedures: none  Follow-Up: Your physician recommends that you schedule a follow-up appointment in: 3 MONTHS WITH DR END.   Please monitor your blood pressure and heart rate at home over the next week. Records these readings and call us next week to let us know what they are.  Take your blood pressure about two hours after your medications about the same time each day.   If you need a refill on your cardiac medications before your next appointment, please call your pharmacy.

## 2017-11-04 NOTE — Progress Notes (Signed)
Office Visit    Patient Name: [name] Date of Encounter: 11/04/2017  Primary Care Provider:  Fayrene Helper, NP Primary Cardiologist:  Yvonne Kendall, MD  Chief Complaint    84 y/o ? with a h/o CAD s/p CABG, HTN, and CHF, who presents for f/u.  Past Medical History    Past Medical History:  Diagnosis Date  . (HFpEF) heart failure with preserved ejection fraction (HCC)    a. 06/2017 Echo: EF 55-60%, Gr1 DD, triv AI.  Marland Kitchen CAD (coronary artery disease)    a. 2013 S/P CABG x3 [facility] - LIMA0>LAD, VG->D1, VG->OM1); b. 2016 Cath @ [facility] - results not available; c. 08/2017 Lexiscan MV: No ischemia/scar. EF 43%.  . Chest pain   . Hyperglycemia   . Hypertension    Past Surgical History:  Procedure Laterality Date  . CORONARY ARTERY BYPASS GRAFT  05/25/2011   [facility]: LIMA->LAD, SVG->D1, and SVG-OM1    Allergies  No Known Allergies  History of Present Illness    84 year old with the above past medical history including coronary artery disease status post CABG x3 in 2013 at [facility], hypertension, heart failure, and syncope.  He was previously followed by Dr. Park Breed.  In April of this year, he was admitted to [facility] with chest discomfort.  Troponins were normal.  Echocardiogram showed normal LV function with grade 1 diastolic dysfunction.  He was subsequently discharged.  He followed up with Dr. Okey Dupre in late May, at which time he was doing well.  He subsequently underwent a pharmacologic stress test which showed no evidence of ischemia or infarct.  He was hypertensive at his last visit and his ramipril was increased to 10 mg twice daily.  Follow-up labs in mid June showed stable renal function and electrolytes.  Since his last visit, he is continued to do well.  He has had no recurrence of chest pain.  He ambulates with a cane but does not experience dyspnea on exertion.  He denies PND, orthopnea, dizziness, syncope, edema, or early satiety.  He checks his blood pressure at home  regularly and says it typically runs in the 130s.  Pressure was 194/86 upon arrival here today.  Home Medications    Prior to Admission medications   Medication Sig Start Date End Date Taking? Authorizing Provider  allopurinol (ZYLOPRIM) 100 MG tablet Take 100 mg by mouth daily. 06/24/17  Yes [provider]  aspirin EC 81 MG tablet Take 1 tablet (81 mg total) by mouth daily. 08/05/17  Yes End, Cristal Deer, MD  furosemide (LASIX) 20 MG tablet Take 20 mg by mouth daily. 06/11/17  Yes [provider]  metoprolol tartrate (LOPRESSOR) 25 MG tablet Take 25 mg by mouth 2 (two) times daily. 05/07/17  Yes [provider]  potassium chloride (MICRO-K) 10 MEQ CR capsule Take 10 mEq by mouth daily. 06/20/17  Yes [provider]  ramipril (ALTACE) 10 MG capsule Take 1 capsule (10 mg total) by mouth 2 (two) times daily. 08/05/17 11/03/17  End, Cristal Deer, MD    Review of Systems    He denies chest pain, palpitations, dyspnea, pnd, orthopnea, n, v, dizziness, syncope, edema, weight gain, or early satiety.  All other systems reviewed and are otherwise negative except as noted above.  Physical Exam    VS:  BP (!) 194/86 (BP Location: Left Arm, Patient Position: Sitting, Cuff Size: Normal)   Pulse 69   Ht 5\' 9"  (1.753 m)   Wt 191 lb 4 oz (  86.8 kg)   BMI 28.24 kg/m  , BMI Body mass index is 28.24 kg/m.  I repeated his blood pressure and got 176/90 GEN: Well nourished, well developed, in no acute distress.  HEENT: normal.  Neck: Supple, no JVD, carotid bruits, or masses. Cardiac: RRR, no murmurs, rubs, or gallops. No clubbing, cyanosis, edema.  Radials/DP/PT 2+ and equal bilaterally.  Respiratory:  Respirations regular and unlabored, clear to auscultation bilaterally. GI: Soft, nontender, nondistended, BS + x 4. MS: no deformity or atrophy. Skin: warm and dry, no rash. Neuro:  Strength and sensation are intact. Psych: Normal affect.  Accessory Clinical Findings      ECG -sinus rhythm, 69, first-degree AV block, right bundle branch block.  No acute changes.  Assessment & Plan    1.  Coronary artery disease: Status post CABG x3 in 2013.  He was recently admitted in April 2019 with chest pain and ruled out.  Stress testing in June was low risk and nonischemic.  He has had no recurrence of chest pain.  He remains on aspirin and beta-blocker therapy.  LDL was 51 in April in the absence of statin therapy and given advanced age, decision was made to forego statin therapy.  2.  Essential hypertension: Following his last visit, his ramipril was increased to 10 mg twice daily in the setting of hypertension.  He says his pressures have been running generally in the upper 130s at home.  He is 194/86 today and 176/90 by my repeat.  I am going to add amlodipine 5 mg daily.  He otherwise remains on beta-blocker, ACE inhibitor, and low-dose Lasix.  I have asked him to follow his blood pressure over the next week and contact us with numbers in a week or so.  3.  Disposition: Follow-up in 3 months or sooner if necessary.  He will contact us with blood pressures at home.  [name], NP 11/04/2017, 11:56 AM

## 2017-11-12 ENCOUNTER — Telehealth: Payer: Self-pay | Admitting: Internal Medicine

## 2017-11-12 ENCOUNTER — Other Ambulatory Visit: Payer: Self-pay

## 2017-11-12 MED ORDER — RAMIPRIL 10 MG PO CAPS: 10.0000 mg | ORAL_CAPSULE | Freq: Two times a day (BID) | ORAL | 0 refills | Status: DC

## 2017-11-12 NOTE — Telephone Encounter (Signed)
S/w patient. He does not have any complaints with taking amlodipine. He does not have recorded any heart rate measurements. Routing to Ward Givens, NP for review as he requested patient to call after recent office visit.

## 2017-11-12 NOTE — Telephone Encounter (Signed)
ramipril (ALTACE) 10 MG capsule 180 capsule 0 11/12/2017 02/10/2018   Sig - Route: Take 1 capsule (10 mg total) by mouth 2 (two) times daily. - Oral   Sent to pharmacy as: ramipril (ALTACE) 10 MG capsule   E-Prescribing Status: Sent to pharmacy (11/12/2017 11:54 AM EDT)   Pharmacy   [id number] - [place], [place] - [address]

## 2017-11-12 NOTE — Telephone Encounter (Signed)
Patient not home, wife answered. She verbalized understanding for patient to continue all medications and that BP's were improved. She will let the patient know.

## 2017-11-12 NOTE — Telephone Encounter (Signed)
BP LOG    8/30  133/71  0930  8/31  128/67  1000   09/01  126/72  0933  09/02  128/69  1000  09/03  133/68  1030  09/04  125/63  1100   09/05  135/72  1015  9/6  126/72  0940

## 2017-11-12 NOTE — Telephone Encounter (Signed)
°*  STAT* If patient is at the pharmacy, call can be transferred to refill team.   1. Which medications need to be refilled? (please list name of each medication and dose if known)  Ramipril 100 mg po q day   2. Which pharmacy/location (including street and city if local pharmacy) is medication to be sent to? [facility] [address] [phone] 3. Do they need a 30 day or 90 day supply? 90

## 2017-11-12 NOTE — Telephone Encounter (Signed)
Overall, trend improved and in the 120's.  Cont current dose of amlodipine.

## 2017-12-10 ENCOUNTER — Other Ambulatory Visit: Payer: Self-pay

## 2017-12-10 MED ORDER — METOPROLOL TARTRATE 25 MG PO TABS: 25.0000 mg | ORAL_TABLET | Freq: Two times a day (BID) | ORAL | 1 refills | Status: DC

## 2017-12-10 NOTE — Telephone Encounter (Signed)
°*  STAT* If patient is at the pharmacy, call can be transferred to refill team. ° ° °1. Which medications need to be refilled? (please list name of each medication and dose if known) Metoprolol ° °2. Which pharmacy/location (including street and city if local pharmacy) is medication to be sent to? [facility] ° °3. Do they need a 30 day or 90 day supply? 90 ° °

## 2018-01-26 ENCOUNTER — Ambulatory Visit (INDEPENDENT_AMBULATORY_CARE_PROVIDER_SITE_OTHER): Payer: Medicare Other | Admitting: Internal Medicine

## 2018-01-26 ENCOUNTER — Encounter: Payer: Self-pay | Admitting: Internal Medicine

## 2018-01-26 VITALS — BP 135/73 | HR 72 | Ht 69.0 in | Wt 191.8 lb

## 2018-01-26 DIAGNOSIS — E785 Hyperlipidemia, unspecified: Secondary | ICD-10-CM

## 2018-01-26 DIAGNOSIS — I255 Ischemic cardiomyopathy: Secondary | ICD-10-CM | POA: Diagnosis not present

## 2018-01-26 DIAGNOSIS — I2510 Atherosclerotic heart disease of native coronary artery without angina pectoris: Secondary | ICD-10-CM | POA: Diagnosis not present

## 2018-01-26 DIAGNOSIS — I10 Essential (primary) hypertension: Secondary | ICD-10-CM

## 2018-01-26 DIAGNOSIS — Z79899 Other long term (current) drug therapy: Secondary | ICD-10-CM

## 2018-01-26 MED ORDER — ATORVASTATIN CALCIUM 10 MG PO TABS: 10.0000 mg | ORAL_TABLET | Freq: Every day | ORAL | 3 refills | Status: DC

## 2018-01-26 NOTE — Patient Instructions (Addendum)
Medication Instructions:  Your physician has recommended you make the following change in your medication:  1- STOP taking Potassium Supplement. 2- START Atorvastatin 10 mg by mouth daily at 6pm.  If you need a refill on your cardiac medications before your next appointment, please call your pharmacy.   Lab work: Your physician recommends that you return for lab work in: 3 MONTHS TO CHECK YOUR CHOLESTEROL. - February 20th, 2019. [facility] - You will need to be fasting. - [facility]. You will check in at the front desk to the right as you walk into the atrium. Valet Parking is offered if needed.   - Please have your Primary Care fax us your lab work at [phone].  If you have labs (blood work) drawn today and your tests are completely normal, you will receive your results only by: Marland Kitchen. MyChart Message (if you have MyChart) OR . A paper copy in the mail If you have any lab test that is abnormal or we need to change your treatment, we will call you to review the results.  Testing/Procedures: none  Follow-Up: At [facility], you and your health needs are our priority.  As part of our continuing mission to provide you with exceptional heart care, we have created designated Provider Care Teams.  These Care Teams include your primary Cardiologist (physician) and Advanced Practice Providers (APPs -  Physician Assistants and Nurse Practitioners) who all work together to provide you with the care you need, when you need it. You will need a follow up appointment in 6 months.  Please call our office 2 months in advance to schedule this appointment.  You may see Yvonne Kendallhristopher End, MD or one of the following Advanced Practice Providers on your designated Care Team:   Nicolasa Duckinghristopher Berge, NP Eula Listenyan Dunn, PA-C . Marisue IvanJacquelyn Visser, PA-C

## 2018-01-26 NOTE — Progress Notes (Signed)
Follow-up Outpatient Visit Date: 01/26/2018  Primary Care Provider: Fayrene Erickson, Gary H, NP [address] [place] [id number]  Chief Complaint: Follow-up coronary artery disease and heart failure  HPI:  [name] is a 84 y.o. year-old male with history of coronary artery disease status post CABG (2013), hypertension, systolic heart failure (normal LVEF by echo in 06/2017), and syncope, who presents for follow-up of coronary artery disease and heart failure.  He was last seen in our office by Gary Givenshris Berge, NP, in 10/2017, at which time he was doing well.  He had been [facility] in April with chest discomfort and negative work-up.  Today, [name] reports that he has been feeling well.  He denies chest pain, shortness of breath, palpitations, lightheadedness, orthopnea, and edema.  Home blood pressures typically around 130/70.  He is tolerating his medications well, though he notes atorvastatin was discontinued by his PCP.  He believes this was done because his "cholesterol is gone."  He walks regularly without limitations, albeit with a cane.  He has not fallen.  --------------------------------------------------------------------------------------------------  Past Medical History:  Diagnosis Date  . (HFpEF) heart failure with preserved ejection fraction (HCC)    a. 06/2017 Echo: EF 55-60%, Gr1 DD, triv AI.  Marland Kitchen. CAD (coronary artery disease)    a. 2013 S/P CABG x3 [facility] - LIMA0>LAD, VG->D1, VG->OM1); b. 2016 Cath @ [facility] - results not available; c. 08/2017 Lexiscan MV: No ischemia/scar. EF 43%.  . Chest pain   . Hyperglycemia   . Hypertension    Past Surgical History:  Procedure Laterality Date  . CORONARY ARTERY BYPASS GRAFT  05/25/2011   [facility]: LIMA->LAD, SVG->D1, and SVG-OM1    Current Meds  Medication Sig  . allopurinol (ZYLOPRIM) 100 MG tablet Take 100 mg by mouth daily.  Marland Kitchen. amLODipine (NORVASC) 5 MG tablet Take 1 tablet (5 mg total) by mouth daily.  Marland Kitchen.  aspirin EC 81 MG tablet Take 1 tablet (81 mg total) by mouth daily.  . furosemide (LASIX) 20 MG tablet Take 20 mg by mouth daily.  . metoprolol tartrate (LOPRESSOR) 25 MG tablet Take 1 tablet (25 mg total) by mouth 2 (two) times daily.  . Multiple Vitamin (MULTIVITAMIN) tablet Take 1 tablet by mouth daily.  . potassium chloride (MICRO-K) 10 MEQ CR capsule Take 10 mEq by mouth daily.  . ramipril (ALTACE) 10 MG capsule Take 1 capsule (10 mg total) by mouth 2 (two) times daily.    Allergies: Patient has no known allergies.  Social History   Tobacco Use  . Smoking status: Former Games developermoker  . Smokeless tobacco: Never Used  . Tobacco comment: Quit in 1990  Substance Use Topics  . Alcohol use: Not on file  . Drug use: Never    Family History  Problem Relation Age of Onset  . Hypertension Mother     Review of Systems: A 12-system review of systems was performed and was negative except as noted in the HPI.  --------------------------------------------------------------------------------------------------  Physical Exam: BP 135/73 (BP Location: Left Arm, Patient Position: Sitting, Cuff Size: Normal)   Pulse 72   Ht 5\' 9"  (1.753 m)   Wt 191 lb 12 oz (87 kg)   BMI 28.32 kg/m   General: NAD. HEENT: No conjunctival pallor or scleral icterus. Moist mucous membranes.  OP clear. Neck: Supple without lymphadenopathy, thyromegaly, JVD, or HJR.  Lungs: Normal work of breathing. Clear to auscultation bilaterally without wheezes or crackles. Heart: Regular rate and rhythm without murmurs, rubs, or  gallops. Non-displaced PMI. Abd: Bowel sounds present. Soft, NT/ND without hepatosplenomegaly Ext: Trace pretibial edema bilaterally. Radial, PT, and DP pulses are 2+ bilaterally. Skin: Warm and dry without rash.  EKG: Normal sinus rhythm with first-degree AV block (PR interval 232 ms) and right bundle branch block.  Lab Results  Component Value Date   WBC 10.5 07/05/2017   HGB 13.6 07/05/2017     HCT 39.7 07/05/2017   MCV 95.9 07/05/2017   PLT 257 07/05/2017    Lab Results  Component Value Date   NA 137 08/19/2017   K 4.7 08/19/2017   CL 98 08/19/2017   CO2 26 08/19/2017   BUN 14 08/19/2017   CREATININE 0.95 08/19/2017   GLUCOSE 110 (H) 08/19/2017   ALT 36 07/05/2017    Lab Results  Component Value Date   CHOL 128 07/05/2017   HDL 23 (L) 07/05/2017   LDLCALC 51 07/05/2017   TRIG 270 (H) 07/05/2017   CHOLHDL 5.6 07/05/2017   Outside labs (09/22/2017): CBC: WBC 7.3, hemoglobin 14.7, hematocrit 41.8, platelets 251  CMP: Sodium 135, potassium 5.1, chloride 96, CO2 26, BUN 14, creatinine 0.96, glucose 94, calcium 9.5, AST 34, ALT 32, alkaline phosphatase 84, total bilirubin 0.3, total protein 7.0, albumin 3.9  Lipid panel: Total cholesterol 130, triglycerides 237, HDL 21, LDL 62  Hemoglobin A1c: 5.7% --------------------------------------------------------------------------------------------------  ASSESSMENT AND PLAN: Coronary artery disease No symptoms to suggest worsening coronary insufficiency.  We will continue indefinite low-dose aspirin and focus on secondary prevention.  I advocate for restarting statin therapy, as outlined below.  Ischemic cardiomyopathy LVEF normal by echo in April.  Other than trace pretibial edema, [name] appears euvolemic.  He has NYHA class I-II symptoms.  Most recent labs by his PCP in July show persistent mild hyperkalemia.  I have advised [name] to stop taking his potassium supplement.  We will continue current doses of furosemide, ramipril, and metoprolol.  Hyperlipidemia Although [name] most recent LDL was less than 70 in July, I feel that he would still benefit from at least low to moderate intensity statin therapy in the setting of coronary artery disease status post CABG.  He reports tolerating atorvastatin well in the past.  We have agreed to restart atorvastatin 10 mg daily and obtain a fasting lipid  panel/ALT in about 3 months.  Hypertension Blood pressure upper normal today.  We will defer medication changes; continue current regimen of amlodipine, metoprolol, and ramipril  Follow-up: Return to clinic in 6 months.  Yvonne Kendall, MD 01/26/2018 10:03 AM

## 2018-02-15 ENCOUNTER — Other Ambulatory Visit: Payer: Self-pay | Admitting: Internal Medicine

## 2018-03-04 ENCOUNTER — Other Ambulatory Visit: Payer: Self-pay | Admitting: *Deleted

## 2018-03-04 MED ORDER — METOPROLOL TARTRATE 25 MG PO TABS: 25.0000 mg | ORAL_TABLET | Freq: Two times a day (BID) | ORAL | 0 refills | Status: DC

## 2018-04-25 ENCOUNTER — Other Ambulatory Visit: Payer: Self-pay

## 2018-04-25 MED ORDER — METOPROLOL TARTRATE 25 MG PO TABS: 25.0000 mg | ORAL_TABLET | Freq: Two times a day (BID) | ORAL | 3 refills | Status: DC

## 2018-04-28 ENCOUNTER — Other Ambulatory Visit
Admission: RE | Admit: 2018-04-28 | Discharge: 2018-04-28 | Disposition: A | Discharge status: Home / Self Care | Payer: Medicare Other | Source: Ambulatory Visit | Attending: Internal Medicine | Admitting: Internal Medicine

## 2018-04-28 DIAGNOSIS — Z79899 Other long term (current) drug therapy: Secondary | ICD-10-CM | POA: Diagnosis present

## 2018-04-28 DIAGNOSIS — E785 Hyperlipidemia, unspecified: Secondary | ICD-10-CM | POA: Diagnosis present

## 2018-04-28 DIAGNOSIS — I2510 Atherosclerotic heart disease of native coronary artery without angina pectoris: Secondary | ICD-10-CM

## 2018-04-28 DIAGNOSIS — I10 Essential (primary) hypertension: Secondary | ICD-10-CM | POA: Diagnosis present

## 2018-04-28 LAB — LIPID PANEL
Cholesterol: 90 mg/dL (ref 0–200)
HDL: 22 mg/dL — ABNORMAL LOW (ref >40)
LDL CALC: 41 mg/dL (ref 0–99)
TRIGLYCERIDES: 134 mg/dL (ref <150)
Total CHOL/HDL Ratio: 4.1 RATIO
VLDL: 27 mg/dL (ref 0–40)

## 2018-04-28 LAB — ALT: ALT: 35 U/L (ref 0–44)

## 2018-05-02 ENCOUNTER — Encounter: Payer: Self-pay | Admitting: *Deleted

## 2018-05-31 ENCOUNTER — Other Ambulatory Visit: Payer: Self-pay | Admitting: Internal Medicine

## 2018-07-12 ENCOUNTER — Telehealth: Payer: Self-pay | Admitting: Internal Medicine

## 2018-07-12 NOTE — Telephone Encounter (Signed)
Virtual Visit Pre-Appointment Phone Call  "(Name), I am calling you today to discuss your upcoming appointment. We are currently trying to limit exposure to the virus that causes COVID-19 by seeing patients at home rather than in the office."  1. "What is the BEST phone number to call the day of the visit?" - include this in appointment notes  2. Do you have or have access to (through a family member/friend) a smartphone with video capability that we can use for your visit?" a. If yes - list this number in appt notes as cell (if different from BEST phone #) and list the appointment type as a VIDEO visit in appointment notes b. If no - list the appointment type as a PHONE visit in appointment notes  3. Confirm consent - "In the setting of the current Covid19 crisis, you are scheduled for a (phone or video) visit with your provider on (date) at (time).  Just as we do with many in-office visits, in order for you to participate in this visit, we must obtain consent.  If you'd like, I can send this to your mychart (if signed up) or email for you to review.  Otherwise, I can obtain your verbal consent now.  All virtual visits are billed to your insurance company just like a normal visit would be.  By agreeing to a virtual visit, we'd like you to understand that the technology does not allow for your provider to perform an examination, and thus may limit your provider's ability to fully assess your condition. If your provider identifies any concerns that need to be evaluated in person, we will make arrangements to do so.  Finally, though the technology is pretty good, we cannot assure that it will always work on either your or our end, and in the setting of a video visit, we may have to convert it to a phone-only visit.  In either situation, we cannot ensure that we have a secure connection.  Are you willing to proceed?" STAFF: Did the patient verbally acknowledge consent to telehealth visit? Document  YES/NO here: Yes  4. Advise patient to be prepared - "Two hours prior to your appointment, go ahead and check your blood pressure, pulse, oxygen saturation, and your weight (if you have the equipment to check those) and write them all down. When your visit starts, your provider will ask you for this information. If you have an Apple Watch or Kardia device, please plan to have heart rate information ready on the day of your appointment. Please have a pen and paper handy nearby the day of the visit as well."  5. Give patient instructions for MyChart download to smartphone OR [email or web address] as below if video visit (depending on what platform provider is using)  6. Inform patient they will receive a phone call 15 minutes prior to their appointment time (may be from unknown caller ID) so they should be prepared to answer    TELEPHONE CALL NOTE  [name] has been deemed a candidate for a follow-up tele-health visit to limit community exposure during the Covid-19 pandemic. I spoke with the patient via phone to ensure availability of phone/video source, confirm preferred email & phone number, and discuss instructions and expectations.  I reminded [name] to be prepared with any vital sign and/or heart rhythm information that could potentially be obtained via home monitoring, at the time of his visit. I reminded [name] to expect a phone call prior to  his visit.  [name] 07/12/2018 10:21 AM   INSTRUCTIONS FOR DOWNLOADING THE MYCHART APP TO SMARTPHONE  - The patient must first make sure to have activated MyChart and know their login information - If Apple, go to [employer] and type in MyChart in the search bar and download the app. If Android, ask patient to go to [employer] and type in [place] in the search bar and download the app. The app is free but as with any other app downloads, their phone may require them to verify saved payment information or  Apple/Android password.  - The patient will need to then log into the app with their MyChart username and password, and select [facility] as their healthcare provider to link the account. When it is time for your visit, go to the MyChart app, find appointments, and click Begin Video Visit. Be sure to Select Allow for your device to access the Microphone and Camera for your visit. You will then be connected, and your provider will be with you shortly.  **If they have any issues connecting, or need assistance please contact MyChart service desk [phone])**  **If using a computer, in order to ensure the best quality for their visit they will need to use either of the following Internet Browsers: [employer], or Google Chrome**  IF USING DOXIMITY or DOXY.ME - The patient will receive a link just prior to their visit by text.     FULL LENGTH CONSENT FOR TELE-HEALTH VISIT   I hereby voluntarily request, consent and authorize [facility] and its employed or contracted physicians, physician assistants, nurse practitioners or other licensed health care professionals (the Practitioner), to provide me with telemedicine health care services (the Services") as deemed necessary by the treating Practitioner. I acknowledge and consent to receive the Services by the Practitioner via telemedicine. I understand that the telemedicine visit will involve communicating with the Practitioner through live audiovisual communication technology and the disclosure of certain medical information by electronic transmission. I acknowledge that I have been given the opportunity to request an in-person assessment or other available alternative prior to the telemedicine visit and am voluntarily participating in the telemedicine visit.  I understand that I have the right to withhold or withdraw my consent to the use of telemedicine in the course of my care at any time, without affecting my right to future care  or treatment, and that the Practitioner or I may terminate the telemedicine visit at any time. I understand that I have the right to inspect all information obtained and/or recorded in the course of the telemedicine visit and may receive copies of available information for a reasonable fee.  I understand that some of the potential risks of receiving the Services via telemedicine include:   Delay or interruption in medical evaluation due to technological equipment failure or disruption;  Information transmitted may not be sufficient (e.g. poor resolution of images) to allow for appropriate medical decision making by the Practitioner; and/or   In rare instances, security protocols could fail, causing a breach of personal health information.  Furthermore, I acknowledge that it is my responsibility to provide information about my medical history, conditions and care that is complete and accurate to the best of my ability. I acknowledge that Practitioner's advice, recommendations, and/or decision may be based on factors not within their control, such as incomplete or inaccurate data provided by me or distortions of diagnostic images or specimens that may result from electronic transmissions. I understand  that the practice of medicine is not an exact science and that Practitioner makes no warranties or guarantees regarding treatment outcomes. I acknowledge that I will receive a copy of this consent concurrently upon execution via email to the email address I last provided but may also request a printed copy by calling the office of [facility].    I understand that my insurance will be billed for this visit.   I have read or had this consent read to me.  I understand the contents of this consent, which adequately explains the benefits and risks of the Services being provided via telemedicine.   I have been provided ample opportunity to ask questions regarding this consent and the Services and have had  my questions answered to my satisfaction.  I give my informed consent for the services to be provided through the use of telemedicine in my medical care  By participating in this telemedicine visit I agree to the above.

## 2018-07-29 ENCOUNTER — Encounter: Payer: Self-pay | Admitting: Internal Medicine

## 2018-07-29 ENCOUNTER — Telehealth (INDEPENDENT_AMBULATORY_CARE_PROVIDER_SITE_OTHER): Payer: Medicare Other | Admitting: Internal Medicine

## 2018-07-29 ENCOUNTER — Other Ambulatory Visit: Payer: Self-pay

## 2018-07-29 VITALS — BP 131/71 | HR 68 | Ht 70.0 in | Wt 191.0 lb

## 2018-07-29 DIAGNOSIS — I2510 Atherosclerotic heart disease of native coronary artery without angina pectoris: Secondary | ICD-10-CM | POA: Diagnosis not present

## 2018-07-29 DIAGNOSIS — I255 Ischemic cardiomyopathy: Secondary | ICD-10-CM | POA: Diagnosis not present

## 2018-07-29 DIAGNOSIS — I10 Essential (primary) hypertension: Secondary | ICD-10-CM

## 2018-07-29 DIAGNOSIS — E785 Hyperlipidemia, unspecified: Secondary | ICD-10-CM | POA: Diagnosis not present

## 2018-07-29 NOTE — Patient Instructions (Signed)
Medication Instructions:  Your physician recommends that you continue on your current medications as directed. Please refer to the Current Medication list given to you today.  If you need a refill on your cardiac medications before your next appointment, please call your pharmacy.   Lab work: none If you have labs (blood work) drawn today and your tests are completely normal, you will receive your results only by: . MyChart Message (if you have MyChart) OR . A paper copy in the mail If you have any lab test that is abnormal or we need to change your treatment, we will call you to review the results.  Testing/Procedures: none  Follow-Up: At [facility], you and your health needs are our priority.  As part of our continuing mission to provide you with exceptional heart care, we have created designated Provider Care Teams.  These Care Teams include your primary Cardiologist (physician) and Advanced Practice Providers (APPs -  Physician Assistants and Nurse Practitioners) who all work together to provide you with the care you need, when you need it. You will need a follow up appointment in 6 months.  Please call our office 2 months in advance to schedule this appointment.  You may see Christopher End, MD or one of the following Advanced Practice Providers on your designated Care Team:   Christopher Berge, NP Ryan Dunn, PA-C . Jacquelyn Visser, PA-C     

## 2018-07-29 NOTE — Progress Notes (Signed)
Virtual Visit via Telephone Note   This visit type was conducted due to national recommendations for restrictions regarding the COVID-19 Pandemic (e.g. social distancing) in an effort to limit this patient's exposure and mitigate transmission in our community.  Due to his co-morbid illnesses, this patient is at least at moderate risk for complications without adequate follow up.  This format is felt to be most appropriate for this patient at this time.  The patient did not have access to video technology/had technical difficulties with video requiring transitioning to audio format only (telephone).  All issues noted in this document were discussed and addressed.  No physical exam could be performed with this format.  Please refer to the patient's chart for his  consent to telehealth for [facility].   Date:  07/29/2018   ID:  NIKITH RALPHS, DOB 07-10-1930, MRN [id number]  Patient Location: Home Provider Location: Office  PCP:  Fayrene Helper, NP  Cardiologist:  Yvonne Kendall, MD  Electrophysiologist:  None   Evaluation Performed:  Follow-Up Visit  Chief Complaint: Follow-up coronary artery disease and cardiomyopathy  History of Present Illness:    TEGH BRYS is a 85 y.o. male with history of coronary artery disease status post CABG (2013), hypertension, systolic heart failure (normal LVEF by echo in 06/2017), and syncope.  We are speaking today for follow-up of his coronary artery disease and heart failure.  I last saw Mr. Pallanes in 01/2018, at which time he was doing well.  That atorvastatin had been discontinued by his PCP because his "cholesterol is gone."  We discussed the importance of statin therapy and secondary prevention of coronary artery disease and agreed to restart atorvastatin 10 mg daily.  Repeat lipid panel in February showed an LDL of 41.  Today, Mr. Holte is without complaints other than intermittent pain behind his knees.  He denies other muscle  or joint pain.  He has not experienced any chest pain, shortness of breath, lightheadedness, or edema.  He is using furosemide on a daily basis.  He is tolerating atorvastatin well.  He notes that his home blood pressures are usually similar to today's reading.  The patient does not have symptoms concerning for COVID-19 infection (fever, chills, cough, or new shortness of breath).    Past Medical History:  Diagnosis Date  . (HFpEF) heart failure with preserved ejection fraction (HCC)    a. 06/2017 Echo: EF 55-60%, Gr1 DD, triv AI.  Marland Kitchen CAD (coronary artery disease)    a. 2013 S/P CABG x3 [facility] - LIMA0>LAD, VG->D1, VG->OM1); b. 2016 Cath @ [facility] - results not available; c. 08/2017 Lexiscan MV: No ischemia/scar. EF 43%.  . Chest pain   . Hyperglycemia   . Hypertension    Past Surgical History:  Procedure Laterality Date  . CORONARY ARTERY BYPASS GRAFT  05/25/2011   [facility]: LIMA->LAD, SVG->D1, and SVG-OM1     Current Meds  Medication Sig  . allopurinol (ZYLOPRIM) 100 MG tablet Take 100 mg by mouth daily.  Marland Kitchen amLODipine (NORVASC) 10 MG tablet Take 10 mg by mouth daily.  Marland Kitchen aspirin EC 81 MG tablet Take 1 tablet (81 mg total) by mouth daily.  Marland Kitchen atorvastatin (LIPITOR) 10 MG tablet Take 1 tablet (10 mg total) by mouth daily.  . furosemide (LASIX) 20 MG tablet TAKE 1 TABLET BY MOUTH EVERY MORNING FOR FLUID RETENTION  . metoprolol tartrate (LOPRESSOR) 25 MG tablet Take 1 tablet (25 mg total) by mouth 2 (two) times daily.  Marland Kitchen  Multiple Vitamin (MULTIVITAMIN) tablet Take 1 tablet by mouth daily.  . ramipril (ALTACE) 10 MG capsule TAKE 1 CAPSULE(10 MG) BY MOUTH TWICE DAILY     Allergies:   Patient has no known allergies.   Social History   Tobacco Use  . Smoking status: Former Games developermoker  . Smokeless tobacco: Never Used  . Tobacco comment: Quit in 1990  Substance Use Topics  . Alcohol use: Not on file  . Drug use: Never     Family Hx: The patient's family history includes Hypertension in his mother.   ROS:   Please see the history of present illness.   All other systems reviewed and are negative.   Prior CV studies:   The following studies were reviewed today:  TTE (07/06/2017): Normal LV size and wall thickness.  LVEF 55-60% with normal wall motion and grade 1 diastolic dysfunction.  Trivial aortic regurgitation.  Mitral annular calcification.  Normal RV size and function.  Labs/Other Tests and Data Reviewed:    EKG:  No ECG reviewed.  Recent Labs: 08/19/2017: BUN 14; Creatinine, Ser 0.95; Potassium 4.7; Sodium 137 04/28/2018: ALT 35   Recent Lipid Panel Lab Results  Component Value Date/Time   CHOL 90 04/28/2018 10:54 AM   CHOL 146 05/21/2011 02:21 AM   TRIG 134 04/28/2018 10:54 AM   TRIG 219 (H) 05/21/2011 02:21 AM   HDL 22 (L) 04/28/2018 10:54 AM   HDL 21 (L) 05/21/2011 02:21 AM   CHOLHDL 4.1 04/28/2018 10:54 AM   LDLCALC 41 04/28/2018 10:54 AM   LDLCALC 81 05/21/2011 02:21 AM    Wt Readings from Last 3 Encounters:  07/29/18 191 lb (86.6 kg)  01/26/18 191 lb 12 oz (87 kg)  11/04/17 191 lb 4 oz (86.8 kg)     Objective:    Vital Signs:  BP 131/71 (BP Location: Left Arm, Patient Position: Sitting, Cuff Size: Normal)   Pulse 68   Ht 5\' 10"  (1.778 m)   Wt 191 lb (86.6 kg)   BMI 27.41 kg/m    VITAL SIGNS:  reviewed  ASSESSMENT & PLAN:    Coronary artery disease: No symptoms to suggest worsening coronary insufficiency.  We will plan to continue current medications for secondary prevention.  I encouraged [name] to continue walking on a regular basis.  Ischemic cardiomyopathy: [name] reports good control of his leg swelling with furosemide 20 mg, which he is using on a daily basis.  LVEF was normal on most recent echo in 06/2017.  We will not make any medication changes today, continuing current doses of metoprolol and ramipril.  Hypertension: Blood pressure upper normal.  No medication changes at this time.  Hyperlipidemia: LDL very well  controlled on atorvastatin 10 mg daily.  Given his age and LDL at goal (<70), we will not escalate this any further.  COVID-19 Education: The signs and symptoms of COVID-19 were discussed with the patient and how to seek care for testing (follow up with PCP or arrange E-visit).  The importance of social distancing was discussed today.  Time:   Today, I have spent 8 minutes with the patient with telehealth technology discussing the above problems.     Medication Adjustments/Labs and Tests Ordered: Current medicines are reviewed at length with the patient today.  Concerns regarding medicines are outlined above.   Tests Ordered: None.  Medication Changes: None.  Disposition:  Follow up in 6 month(s)  Signed, Yvonne Kendallhristopher Handsome Anglin, MD  07/29/2018 11:06 AM    [facility]

## 2018-08-31 ENCOUNTER — Other Ambulatory Visit: Payer: Self-pay | Admitting: *Deleted

## 2018-08-31 MED ORDER — METOPROLOL TARTRATE 25 MG PO TABS: 25.0000 mg | ORAL_TABLET | Freq: Two times a day (BID) | ORAL | 2 refills | Status: DC

## 2018-10-07 ENCOUNTER — Other Ambulatory Visit: Payer: Self-pay | Admitting: Internal Medicine

## 2018-11-03 ENCOUNTER — Telehealth: Payer: Self-pay

## 2018-11-03 MED ORDER — AMLODIPINE BESYLATE 10 MG PO TABS: 10.0000 mg | ORAL_TABLET | Freq: Every day | ORAL | 3 refills | Status: DC

## 2018-11-03 NOTE — Telephone Encounter (Signed)
Refill sent for Amlodipine 10 mg  

## 2018-12-01 ENCOUNTER — Other Ambulatory Visit: Payer: Self-pay | Admitting: Internal Medicine

## 2019-01-10 ENCOUNTER — Encounter: Payer: Self-pay | Admitting: Internal Medicine

## 2019-01-23 NOTE — Progress Notes (Signed)
Follow-up Outpatient Visit Date: 01/25/2019  Primary Care Provider: Fayrene Helper, NP [address] [place] [address]  Chief Complaint: Follow-up coronary artery disease  HPI:  [name] is a 85 y.o. male with history of coronary artery disease status post CABG (2013), hypertension, systolic heart failure(normal LVEF by echo in 06/2017), and syncope, who presents for follow-up of coronary artery disease and cardiomyopathy.  We last spoke via virtual visit in May.  At that time, [name] was feeling well other than occasional posterior knee pain.  No medication changes or further testing were pursued at that time.  Today, [name] reports that he has been doing well from a heart standpoint.  He denies chest pain, shortness of breath, palpitations, lightheadedness, orthopnea, and edema.  His biggest limitation is pain involving the right leg, predominantly about the hip and knee.  Home blood pressure readings have been 120-130/60-70.  He is tolerating his current medication regimen well.  --------------------------------------------------------------------------------------------------  Past Medical History:  Diagnosis Date  . (HFpEF) heart failure with preserved ejection fraction (HCC)    a. 06/2017 Echo: EF 55-60%, Gr1 DD, triv AI.  Marland Kitchen CAD (coronary artery disease)    a. 2013 S/P CABG x3 [facility] - LIMA0>LAD, VG->D1, VG->OM1); b. 2016 Cath @ [facility] - results not available; c. 08/2017 Lexiscan MV: No ischemia/scar. EF 43%.  . Chest pain   . Hyperglycemia   . Hypertension    Past Surgical History:  Procedure Laterality Date  . CORONARY ARTERY BYPASS GRAFT  05/25/2011   [facility]: LIMA->LAD, SVG->D1, and SVG-OM1    Current Meds  Medication Sig  . allopurinol (ZYLOPRIM) 100 MG tablet Take 100 mg by mouth daily.  Marland Kitchen amLODipine (NORVASC) 10 MG tablet Take 1 tablet (10 mg total) by mouth daily.  Marland Kitchen aspirin EC 81 MG tablet Take 1 tablet (81 mg total) by mouth daily.  Marland Kitchen  atorvastatin (LIPITOR) 10 MG tablet TAKE 1 TABLET(10 MG) BY MOUTH DAILY  . furosemide (LASIX) 20 MG tablet TAKE 1 TABLET BY MOUTH EVERY MORNING FOR FLUID RETENTION  . metoprolol tartrate (LOPRESSOR) 25 MG tablet Take 1 tablet (25 mg total) by mouth 2 (two) times daily.  . Multiple Vitamin (MULTIVITAMIN) tablet Take 1 tablet by mouth daily.  . ramipril (ALTACE) 10 MG capsule TAKE 1 CAPSULE(10 MG) BY MOUTH TWICE DAILY    Allergies: Patient has no known allergies.  Social History   Tobacco Use  . Smoking status: Former Games developer  . Smokeless tobacco: Never Used  . Tobacco comment: Quit in 1990  Substance Use Topics  . Alcohol use: Not on file  . Drug use: Never    Family History  Problem Relation Age of Onset  . Hypertension Mother     Review of Systems: A 12-system review of systems was performed and was negative except as noted in the HPI.  --------------------------------------------------------------------------------------------------  Physical Exam: BP (!) 148/64 (BP Location: Left Arm, Patient Position: Sitting, Cuff Size: Normal)   Pulse 66   Ht 5\' 10"  (1.778 m)   Wt 189 lb 8 oz (86 kg)   SpO2 99%   BMI 27.19 kg/m   General: NAD. HEENT: No conjunctival pallor or scleral icterus. Facemask in place. Neck: Supple without lymphadenopathy, thyromegaly, JVD, or HJR. Lungs: Normal work of breathing. Clear to auscultation bilaterally without wheezes or crackles. Heart: Regular rate and rhythm without murmurs, rubs, or gallops. Non-displaced PMI. Abd: Bowel sounds present. Soft, NT/ND without hepatosplenomegaly Ext: Trace pretibial edema bilaterally. Skin: Warm and  dry without rash.  EKG: Normal sinus rhythm with first-degree AV block (PR interval 218 ms) and right bundle branch block.  No significant change from prior tracing on 01/26/2018.  Lab Results  Component Value Date   WBC 10.5 07/05/2017   HGB 13.6 07/05/2017   HCT 39.7 07/05/2017   MCV 95.9 07/05/2017   PLT  257 07/05/2017    Lab Results  Component Value Date   NA 137 08/19/2017   K 4.7 08/19/2017   CL 98 08/19/2017   CO2 26 08/19/2017   BUN 14 08/19/2017   CREATININE 0.95 08/19/2017   GLUCOSE 110 (H) 08/19/2017   ALT 35 04/28/2018    Lab Results  Component Value Date   CHOL 90 04/28/2018   HDL 22 (L) 04/28/2018   LDLCALC 41 04/28/2018   TRIG 134 04/28/2018   CHOLHDL 4.1 04/28/2018    --------------------------------------------------------------------------------------------------  ASSESSMENT AND PLAN: Coronary artery disease without angina: No signs or symptoms to suggest worsening coronary insufficiency.  We will continue current medications for secondary prevention.  Ischemic cardiomyopathy: [name] denies significant lower extremity edema and has only trace swelling on exam today.  Some of this may be related to amlodipine use.  He does not have other symptoms of heart failure.  LVEF was normal on most recent echo in 06/2017.  We will continue his current regimen of metoprolol, ramipril, and furosemide.  Hypertension: Blood pressure suboptimally controlled today, though home readings are typically normal.  We stressed the importance of sodium restriction.  We have agreed to defer medication changes at this time.  However, if [name] notes that his home blood pressures are consistently above 140/90, he should contact us to discuss further titration.  Hyperlipidemia: LDL very well controlled on last check in 04/2018.  Continue atorvastatin 10 mg daily, given low LDL and age.  Follow-up: Return to clinic in 1 year.  Nelva Bush, MD 01/25/2019 4:06 PM

## 2019-01-25 ENCOUNTER — Ambulatory Visit (INDEPENDENT_AMBULATORY_CARE_PROVIDER_SITE_OTHER): Payer: Medicare Other | Admitting: Internal Medicine

## 2019-01-25 ENCOUNTER — Encounter: Payer: Self-pay | Admitting: Internal Medicine

## 2019-01-25 ENCOUNTER — Other Ambulatory Visit: Payer: Self-pay

## 2019-01-25 VITALS — BP 148/64 | HR 66 | Ht 70.0 in | Wt 189.5 lb

## 2019-01-25 DIAGNOSIS — I255 Ischemic cardiomyopathy: Secondary | ICD-10-CM | POA: Diagnosis not present

## 2019-01-25 DIAGNOSIS — E785 Hyperlipidemia, unspecified: Secondary | ICD-10-CM | POA: Diagnosis not present

## 2019-01-25 DIAGNOSIS — I2510 Atherosclerotic heart disease of native coronary artery without angina pectoris: Secondary | ICD-10-CM | POA: Diagnosis not present

## 2019-01-25 DIAGNOSIS — I10 Essential (primary) hypertension: Secondary | ICD-10-CM

## 2019-01-25 NOTE — Patient Instructions (Signed)
Medication Instructions:  Your physician recommends that you continue on your current medications as directed. Please refer to the Current Medication list given to you today.  *If you need a refill on your cardiac medications before your next appointment, please call your pharmacy*  Lab Work: Worthy Keeler will be requested from your primary care physician.  If you have labs (blood work) drawn today and your tests are completely normal, you will receive your results only by: Marland Kitchen MyChart Message (if you have MyChart) OR . A paper copy in the mail If you have any lab test that is abnormal or we need to change your treatment, we will call you to review the results.  Testing/Procedures: none  Follow-Up: At [facility], you and your health needs are our priority.  As part of our continuing mission to provide you with exceptional heart care, we have created designated Provider Care Teams.  These Care Teams include your primary Cardiologist (physician) and Advanced Practice Providers (APPs -  Physician Assistants and Nurse Practitioners) who all work together to provide you with the care you need, when you need it.  Your next appointment:   12 month(s)  The format for your next appointment:   In Person  Provider:    You may see Nelva Bush, MD or one of the following Advanced Practice Providers on your designated Care Team:    Murray Hodgkins, NP  Christell Faith, PA-C  Marrianne Mood, PA-C

## 2019-01-26 ENCOUNTER — Encounter: Payer: Self-pay | Admitting: Internal Medicine

## 2019-01-30 ENCOUNTER — Telehealth: Payer: Self-pay

## 2019-01-30 MED ORDER — ATORVASTATIN CALCIUM 10 MG PO TABS: ORAL_TABLET | ORAL | 3 refills | Status: DC

## 2019-01-30 NOTE — Telephone Encounter (Signed)
Requested Prescriptions   Signed Prescriptions Disp Refills  . atorvastatin (LIPITOR) 10 MG tablet 90 tablet 3    Sig: TAKE 1 TABLET(10 MG) BY MOUTH DAILY    Authorizing Provider: END, CHRISTOPHER    Ordering User: NEWCOMER MCCLAIN, BRANDY L

## 2019-02-08 ENCOUNTER — Encounter: Payer: Self-pay | Admitting: *Deleted

## 2019-02-14 ENCOUNTER — Other Ambulatory Visit: Payer: Self-pay | Admitting: *Deleted

## 2019-02-14 MED ORDER — RAMIPRIL 10 MG PO CAPS: ORAL_CAPSULE | ORAL | 2 refills | Status: DC

## 2019-03-13 ENCOUNTER — Other Ambulatory Visit: Payer: Self-pay | Admitting: *Deleted

## 2019-03-13 MED ORDER — METOPROLOL TARTRATE 25 MG PO TABS: 25.0000 mg | ORAL_TABLET | Freq: Two times a day (BID) | ORAL | 2 refills | Status: DC

## 2019-04-23 ENCOUNTER — Ambulatory Visit: Payer: Medicare Other | Attending: Internal Medicine

## 2019-04-23 DIAGNOSIS — Z23 Encounter for immunization: Secondary | ICD-10-CM | POA: Insufficient documentation

## 2019-04-23 NOTE — Progress Notes (Signed)
   Covid-19 Vaccination Clinic  Name:  RAS KOLLMAN    MRN: [id number] DOB: 23-Dec-1930  04/23/2019  Mr. Azbill was observed post Covid-19 immunization for 30 minutes based on pre-vaccination screening without incidence. He was provided with Vaccine Information Sheet and instruction to access the V-Safe system.   Mr. Meras was instructed to call 911 with any severe reactions post vaccine: Marland Kitchen Difficulty breathing  . Swelling of your face and throat  . A fast heartbeat  . A bad rash all over your body  . Dizziness and weakness    Immunizations Administered    Name Date Dose VIS Date Route   Pfizer COVID-19 Vaccine 04/23/2019  2:11 PM 0.3 mL 02/17/2019 Intramuscular   Manufacturer: [employer], [employer]   Lot: [id number]   NDC: [address]-8

## 2019-05-16 ENCOUNTER — Ambulatory Visit: Payer: Medicare Other | Attending: Internal Medicine

## 2019-05-16 DIAGNOSIS — Z23 Encounter for immunization: Secondary | ICD-10-CM | POA: Insufficient documentation

## 2019-05-16 NOTE — Progress Notes (Signed)
   Covid-19 Vaccination Clinic  Name:  [name]    MRN: [id number] DOB: 1930-10-05  05/16/2019  [name] was observed post Covid-19 immunization for 30 minutes based on pre-vaccination screening without incident. He was provided with Vaccine Information Sheet and instruction to access the V-Safe system.   [name] was instructed to call 911 with any severe reactions post vaccine: Marland Kitchen Difficulty breathing  . Swelling of face and throat  . A fast heartbeat  . A bad rash all over body  . Dizziness and weakness   Immunizations Administered    Name Date Dose VIS Date Route   Pfizer COVID-19 Vaccine 05/16/2019  2:31 PM 0.3 mL 02/17/2019 Intramuscular   Manufacturer: [employer], [employer]   Lot: [id number]   NDC: [address]-7

## 2019-05-17 ENCOUNTER — Ambulatory Visit: Payer: Medicare Other

## 2019-05-29 ENCOUNTER — Other Ambulatory Visit: Payer: Self-pay

## 2019-05-29 MED ORDER — METOPROLOL TARTRATE 25 MG PO TABS: 25.0000 mg | ORAL_TABLET | Freq: Two times a day (BID) | ORAL | 2 refills | Status: DC

## 2019-08-28 ENCOUNTER — Other Ambulatory Visit: Payer: Self-pay

## 2019-08-28 MED ORDER — METOPROLOL TARTRATE 25 MG PO TABS: 25.0000 mg | ORAL_TABLET | Freq: Two times a day (BID) | ORAL | 2 refills | Status: DC

## 2019-10-09 ENCOUNTER — Other Ambulatory Visit: Payer: Self-pay | Admitting: Internal Medicine

## 2019-10-24 ENCOUNTER — Other Ambulatory Visit: Payer: Self-pay

## 2019-10-24 MED ORDER — AMLODIPINE BESYLATE 10 MG PO TABS: 10.0000 mg | ORAL_TABLET | Freq: Every day | ORAL | 3 refills | Status: DC

## 2019-11-16 ENCOUNTER — Other Ambulatory Visit: Payer: Self-pay | Admitting: Cardiovascular Disease

## 2019-11-23 ENCOUNTER — Other Ambulatory Visit: Payer: Self-pay | Admitting: Internal Medicine

## 2019-12-16 ENCOUNTER — Other Ambulatory Visit: Payer: Self-pay | Admitting: Internal Medicine

## 2019-12-29 ENCOUNTER — Other Ambulatory Visit: Payer: Self-pay | Admitting: Internal Medicine

## 2020-01-22 NOTE — Progress Notes (Signed)
Follow-up Outpatient Visit Date: 01/24/2020  Primary Care Provider: Fayrene Helper, NP [address] [place] [address]  Chief Complaint: Follow-up coronary artery disease and heart failure with recovered ejection fraction  HPI:  [name] is a 86 y.o. male with history of coronary artery disease status post CABG (2013), hypertension, systolic heart failure(normal LVEF by echo in 06/2017), and syncope, who presents for follow-up of CAD and cardiomyopathy.  I last saw him a year ago, at which time he was doing well from a heart standpoint.  He complained about pain involving the right hip and knee.  Today, [name] reports that he has been doing well from a heart standpoint.  He underwent successful right hip replacement with resolution of his groin pain.  He now reports that he will need surgery on both knees.  He is scheduled to speak with his orthopedist tomorrow.  He denies chest pain, shortness of breath, palpitations, lightheadedness, and edema.  He is able to climb 2 flights of stairs without angina.  Home blood pressures typically 120-130/60-70.  --------------------------------------------------------------------------------------------------  Past Medical History:  Diagnosis Date  . (HFpEF) heart failure with preserved ejection fraction (HCC)    a. 06/2017 Echo: EF 55-60%, Gr1 DD, triv AI.  Marland Kitchen CAD (coronary artery disease)    a. 2013 S/P CABG x3 [facility] - LIMA0>LAD, VG->D1, VG->OM1); b. 2016 Cath @ [facility] - results not available; c. 08/2017 Lexiscan MV: No ischemia/scar. EF 43%.  . Chest pain   . Hyperglycemia   . Hypertension    Past Surgical History:  Procedure Laterality Date  . CORONARY ARTERY BYPASS GRAFT  05/25/2011   [facility]: LIMA->LAD, SVG->D1, and SVG-OM1    Current Meds  Medication Sig  . allopurinol (ZYLOPRIM) 100 MG tablet Take 100 mg by mouth daily.  Marland Kitchen amLODipine (NORVASC) 10 MG tablet Take 1 tablet (10 mg total) by mouth daily.  Marland Kitchen aspirin EC 81 MG  tablet Take 1 tablet (81 mg total) by mouth daily.  Marland Kitchen atorvastatin (LIPITOR) 10 MG tablet TAKE 1 TABLET(10 MG) BY MOUTH DAILY  . furosemide (LASIX) 20 MG tablet TAKE 1 TABLET BY MOUTH EVERY MORNING FOR FLUID RETENTION  . metoprolol tartrate (LOPRESSOR) 25 MG tablet TAKE 1 TABLET(25 MG) BY MOUTH TWICE DAILY  . Multiple Vitamins-Minerals (CENTRUM SILVER ULTRA MENS PO) Take 1 tablet by mouth daily.  . ramipril (ALTACE) 10 MG capsule TAKE 1 CAPSULE(10 MG) BY MOUTH TWICE DAILY  . senna-docusate (SENOKOT-S) 8.6-50 MG tablet Take by mouth.    Allergies: Tetanus toxoids  Social History   Tobacco Use  . Smoking status: Former Games developer  . Smokeless tobacco: Never Used  . Tobacco comment: Quit in 1990  Substance Use Topics  . Alcohol use: Not on file  . Drug use: Never    Family History  Problem Relation Age of Onset  . Hypertension Mother     Review of Systems: A 12-system review of systems was performed and was negative except as noted in the HPI.  --------------------------------------------------------------------------------------------------  Physical Exam: BP 140/64 (BP Location: Left Arm, Patient Position: Sitting, Cuff Size: Normal)   Pulse 70   Ht 5\' 10"  (1.778 m)   Wt 184 lb (83.5 kg)   SpO2 96%   BMI 26.40 kg/m   General:  NAD. HEENT: No conjunctival pallor or scleral icterus. Facemask in place. Neck: Supple without lymphadenopathy, thyromegaly, JVD, or HJR. Lungs: Normal work of breathing. Clear to auscultation bilaterally without wheezes or crackles. Heart: Regular rate and rhythm without  murmurs, rubs, or gallops. Abd: Bowel sounds present. Soft, NT/ND without hepatosplenomegaly Ext: No lower extremity edema.  EKG: Normal sinus rhythm with first-degree AV block and right bundle branch block.  PR interval has lengthened since prior tracing on 01/25/2019.  Otherwise, there has been no significant interval change.  Lab Results  Component Value Date   WBC 10.5  07/05/2017   HGB 13.6 07/05/2017   HCT 39.7 07/05/2017   MCV 95.9 07/05/2017   PLT 257 07/05/2017    Lab Results  Component Value Date   NA 137 08/19/2017   K 4.7 08/19/2017   CL 98 08/19/2017   CO2 26 08/19/2017   BUN 14 08/19/2017   CREATININE 0.95 08/19/2017   GLUCOSE 110 (H) 08/19/2017   ALT 35 04/28/2018    Lab Results  Component Value Date   CHOL 90 04/28/2018   HDL 22 (L) 04/28/2018   LDLCALC 41 04/28/2018   TRIG 134 04/28/2018   CHOLHDL 4.1 04/28/2018    --------------------------------------------------------------------------------------------------  ASSESSMENT AND PLAN: Coronary artery disease without angina: [name] continues to do well without symptoms to suggest worsening coronary insufficiency.  We will defer medication changes today.  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  His mobility is limited by knee pain, though he does not endorse any significant heart failure symptoms.  In light of his worsening first-degree AV block, we have agreed to change metoprolol tartrate 25 mg twice daily to metoprolol succinate 25 mg daily.  We will also continue ramipril 10 mg twice daily and furosemide 20 mg daily.  Hypertension: Blood pressure mildly elevated today.  As above, I will decrease metoprolol dosing to 25 mg daily in the setting of worsening PR prolongation.  I have asked [name] to monitor his blood pressure at home.  If it is consistently elevated, we will need to consider adding another agent, as he is on maximal doses of amlodipine and ramipril.  Hyperlipidemia: LDL at goal on last check in 04/2018.  We will check a lipid panel and CMP today.  Atorvastatin should be continued for goal LDL less than 70.  Preoperative cardiovascular risk assessment: [name] reports that he is in need of bilateral knee surgery.  He is able to complete more than 4 METS of activity without symptoms.  He does  not have signs or symptoms of unstable cardiac disease.  Given low risk nature of knee surgery, I think it is reasonable for [name] to proceed without additional cardiac testing or intervention.  If possible, low-dose aspirin should be continued in the perioperative period.  Follow-up: Return to clinic in 6 months.  Yvonne Kendall, MD 01/24/2020 9:44 AM

## 2020-01-24 ENCOUNTER — Other Ambulatory Visit: Payer: Self-pay

## 2020-01-24 ENCOUNTER — Ambulatory Visit: Payer: Medicare Other | Admitting: Internal Medicine

## 2020-01-24 ENCOUNTER — Encounter: Payer: Self-pay | Admitting: Internal Medicine

## 2020-01-24 VITALS — BP 140/64 | HR 70 | Ht 70.0 in | Wt 184.0 lb

## 2020-01-24 DIAGNOSIS — I10 Essential (primary) hypertension: Secondary | ICD-10-CM | POA: Diagnosis not present

## 2020-01-24 DIAGNOSIS — I2510 Atherosclerotic heart disease of native coronary artery without angina pectoris: Secondary | ICD-10-CM

## 2020-01-24 DIAGNOSIS — I509 Heart failure, unspecified: Secondary | ICD-10-CM

## 2020-01-24 DIAGNOSIS — Z01810 Encounter for preprocedural cardiovascular examination: Secondary | ICD-10-CM

## 2020-01-24 DIAGNOSIS — E785 Hyperlipidemia, unspecified: Secondary | ICD-10-CM

## 2020-01-24 MED ORDER — METOPROLOL SUCCINATE ER 25 MG PO TB24: 25.0000 mg | ORAL_TABLET | Freq: Every day | ORAL | 2 refills | Status: DC

## 2020-01-24 NOTE — Patient Instructions (Signed)
Medication Instructions:  Your physician has recommended you make the following change in your medication:  1- STOP Metoprolol tartrate. 2- START Metoprolol succinate 25 mg by mouth once a day.  *If you need a refill on your cardiac medications before your next appointment, please call your pharmacy*  Follow-Up: At [facility], you and your health needs are our priority.  As part of our continuing mission to provide you with exceptional heart care, we have created designated Provider Care Teams.  These Care Teams include your primary Cardiologist (physician) and Advanced Practice Providers (APPs -  Physician Assistants and Nurse Practitioners) who all work together to provide you with the care you need, when you need it.  We recommend signing up for the patient portal called "MyChart".  Sign up information is provided on this After Visit Summary.  MyChart is used to connect with patients for Virtual Visits (Telemedicine).  Patients are able to view lab/test results, encounter notes, upcoming appointments, etc.  Non-urgent messages can be sent to your provider as well.   To learn more about what you can do with MyChart, go to [email or web address].    Your next appointment:   6 month(s)  The format for your next appointment:   In Person  Provider:   You may see Yvonne Kendall, MD or one of the following Advanced Practice Providers on your designated Care Team:    Nicolasa Ducking, NP  Eula Listen, PA-C  Marisue Ivan, PA-C  Cadence [place], [place]  Gillian Shields, NP

## 2020-01-25 ENCOUNTER — Encounter: Payer: Self-pay | Admitting: Internal Medicine

## 2020-01-28 ENCOUNTER — Other Ambulatory Visit: Payer: Self-pay | Admitting: Cardiovascular Disease

## 2020-04-27 ENCOUNTER — Other Ambulatory Visit: Payer: Self-pay | Admitting: Internal Medicine

## 2020-05-03 ENCOUNTER — Other Ambulatory Visit: Payer: Self-pay | Admitting: Internal Medicine

## 2020-07-24 ENCOUNTER — Telehealth: Payer: Self-pay

## 2020-07-24 NOTE — Telephone Encounter (Signed)
Please review for refill on Nitroglycerin,  I do not see NTG 0.4 mg on the patient's medication list.  The patient lost the NTG bottle while mowing the yard.

## 2020-07-25 NOTE — Telephone Encounter (Signed)
Pt is requesting refill of SL Nitroglycerin 0.4mg .  Pt last seen in office with Dr. Okey Dupre 01/24/20.  Nitroglycerin was not on pt's med list at time of this visit.   Per Mandie's previous note,  Pt was prescribe Nitro in 2015 while seen at the ED per notes  Nitro is not noted on his medication list within the past several several years.  Unsure if these pills are old, don't know when the last time it was refilled as there is not order in our EMR for refills in the past years.  Would reach out to his primary cardiologist to advice.     Pt does have follow up scheduled with Dr. Okey Dupre 6/1. Please advise if refill approved or would like to see pt in clinic first.

## 2020-07-25 NOTE — Telephone Encounter (Signed)
It is fine to refill prescription for sublingual nitroglycerin.  It should be noted that the patient was not complaining of chest pain when I last saw him.  If he is requesting a refill because he has developed angina, we should try to get him in to be seen as soon as possible for further evaluation.  Yvonne Kendall, MD [facility]

## 2020-07-26 ENCOUNTER — Other Ambulatory Visit: Payer: Self-pay | Admitting: Internal Medicine

## 2020-07-26 MED ORDER — NITROGLYCERIN 0.4 MG SL SUBL: 0.4000 mg | SUBLINGUAL_TABLET | SUBLINGUAL | 6 refills | Status: DC | PRN

## 2020-07-26 NOTE — Telephone Encounter (Signed)
Spoke with the patient and he is not having any chest pain. He states that he was told that he should just keep some in his pocket just in case he needs it. Prescription has been sent in.

## 2020-08-06 ENCOUNTER — Other Ambulatory Visit: Payer: Self-pay | Admitting: Internal Medicine

## 2020-08-07 ENCOUNTER — Ambulatory Visit: Payer: Medicare Other | Admitting: Internal Medicine

## 2020-08-07 ENCOUNTER — Other Ambulatory Visit: Payer: Self-pay

## 2020-08-07 ENCOUNTER — Encounter: Payer: Self-pay | Admitting: Internal Medicine

## 2020-08-07 VITALS — BP 140/74 | HR 78 | Ht 68.0 in | Wt 191.0 lb

## 2020-08-07 DIAGNOSIS — I2510 Atherosclerotic heart disease of native coronary artery without angina pectoris: Secondary | ICD-10-CM

## 2020-08-07 DIAGNOSIS — I440 Atrioventricular block, first degree: Secondary | ICD-10-CM

## 2020-08-07 DIAGNOSIS — I5032 Chronic diastolic (congestive) heart failure: Secondary | ICD-10-CM | POA: Diagnosis not present

## 2020-08-07 DIAGNOSIS — I10 Essential (primary) hypertension: Secondary | ICD-10-CM | POA: Diagnosis not present

## 2020-08-07 DIAGNOSIS — E785 Hyperlipidemia, unspecified: Secondary | ICD-10-CM

## 2020-08-07 MED ORDER — METOPROLOL SUCCINATE ER 25 MG PO TB24: 12.5000 mg | ORAL_TABLET | Freq: Every day | ORAL | 3 refills | Status: DC

## 2020-08-07 MED ORDER — NITROGLYCERIN 0.4 MG SL SUBL: 0.4000 mg | SUBLINGUAL_TABLET | SUBLINGUAL | 3 refills | Status: DC | PRN

## 2020-08-07 NOTE — Progress Notes (Signed)
Follow-up Outpatient Visit Date: 08/07/2020  Primary Care Provider: Fayrene Helper, NP [address] [place] [address]  Chief Complaint: Follow-up CAD and hypertension  HPI:  [name] is a 87 y.o. male with history of coronary artery disease status post CABG in 2013 (LIMA to LAD, SVG to D1, and SVG to OM1), hypertension, HFrEF with normalization of LVEF, and syncope, who presents for follow-up of coronary artery disease and cardiomyopathy.  I last saw him in 01/2020, at which time [name] doing well from a heart standpoint.  He underwent successful right hip arthroplasty with resolution of his groin pain.  Today, [name] reports that he continues to feel well.  He denies chest pain, shortness of breath, palpitations, lightheadedness, and edema.  He reports home BP around 130/70.  He is tolerating his medications well.  --------------------------------------------------------------------------------------------------  Past Medical History:  Diagnosis Date  . (HFpEF) heart failure with preserved ejection fraction (HCC)    a. 06/2017 Echo: EF 55-60%, Gr1 DD, triv AI.  Marland Kitchen CAD (coronary artery disease)    a. 2013 S/P CABG x3 [facility] - LIMA0>LAD, VG->D1, VG->OM1); b. 2016 Cath @ [facility] - results not available; c. 08/2017 Lexiscan MV: No ischemia/scar. EF 43%.  . Chest pain   . Hyperglycemia   . Hypertension    Past Surgical History:  Procedure Laterality Date  . CORONARY ARTERY BYPASS GRAFT  05/25/2011   [facility]: LIMA->LAD, SVG->D1, and SVG-OM1  . TOTAL HIP ARTHROPLASTY Right    May 2021     Recent CV Pertinent Labs: Lab Results  Component Value Date   CHOL 90 04/28/2018   CHOL 146 05/21/2011   HDL 22 (L) 04/28/2018   HDL 21 (L) 05/21/2011   LDLCALC 41 04/28/2018   LDLCALC 81 05/21/2011   TRIG 134 04/28/2018   TRIG 219 (H) 05/21/2011   CHOLHDL 4.1 04/28/2018   INR 1.1 05/21/2011   K 4.7 08/19/2017   K 3.6 02/27/2014   BUN 14 08/19/2017   BUN 19 (H)  02/27/2014   CREATININE 0.95 08/19/2017   CREATININE 1.26 02/27/2014    Past medical and surgical history were reviewed and updated in EPIC.  Current Meds  Medication Sig  . allopurinol (ZYLOPRIM) 100 MG tablet Take 100 mg by mouth daily.  Marland Kitchen amLODipine (NORVASC) 10 MG tablet Take 1 tablet (10 mg total) by mouth daily.  Marland Kitchen aspirin EC 81 MG tablet Take 1 tablet (81 mg total) by mouth daily.  Marland Kitchen atorvastatin (LIPITOR) 10 MG tablet TAKE 1 TABLET(10 MG) BY MOUTH DAILY  . furosemide (LASIX) 20 MG tablet TAKE 1 TABLET BY MOUTH EVERY MORNING FOR FLUID RETENTION  . metoprolol succinate (TOPROL-XL) 25 MG 24 hr tablet TAKE 1 TABLET(25 MG) BY MOUTH DAILY  . Multiple Vitamins-Minerals (CENTRUM SILVER ULTRA MENS PO) Take 1 tablet by mouth daily.  . nitroGLYCERIN (NITROSTAT) 0.4 MG SL tablet Place 1 tablet (0.4 mg total) under the tongue every 5 (five) minutes as needed for chest pain.  . ramipril (ALTACE) 10 MG capsule TAKE 1 CAPSULE(10 MG) BY MOUTH TWICE DAILY    Allergies: Tetanus toxoids  Social History   Tobacco Use  . Smoking status: Former Games developer  . Smokeless tobacco: Never Used  . Tobacco comment: Quit in 1990  Vaping Use  . Vaping Use: Never used  Substance Use Topics  . Alcohol use: Not Currently  . Drug use: Never    Family History  Problem Relation Age of Onset  . Hypertension Mother  Review of Systems: A 12-system review of systems was performed and was negative except as noted in the HPI.  --------------------------------------------------------------------------------------------------  Physical Exam: BP 140/74 (BP Location: Left Arm, Patient Position: Sitting, Cuff Size: Large)   Pulse 78   Ht 5\' 8"  (1.727 m)   Wt 191 lb (86.6 kg)   SpO2 96%   BMI 29.04 kg/m   General:  NAD. Neck: No JVD or HJR. Lungs: Clear to auscultation bilaterally without wheezes or crackles. Heart: Regular rate and rhythm without murmurs, rubs, or gallops. Abdomen: Soft, nontender,  nondistended. Extremities: Trace ankle edema bilaterally.  EKG:  NSR with first degree AV block (PR interval 266 ms) and RBBB.  PR interval slightly longer compared with 01/24/2020.  Otherwise, no significant change.  Lab Results  Component Value Date   WBC 10.5 07/05/2017   HGB 13.6 07/05/2017   HCT 39.7 07/05/2017   MCV 95.9 07/05/2017   PLT 257 07/05/2017    Lab Results  Component Value Date   NA 137 08/19/2017   K 4.7 08/19/2017   CL 98 08/19/2017   CO2 26 08/19/2017   BUN 14 08/19/2017   CREATININE 0.95 08/19/2017   GLUCOSE 110 (H) 08/19/2017   ALT 35 04/28/2018    Lab Results  Component Value Date   CHOL 90 04/28/2018   HDL 22 (L) 04/28/2018   LDLCALC 41 04/28/2018   TRIG 134 04/28/2018   CHOLHDL 4.1 04/28/2018    --------------------------------------------------------------------------------------------------  ASSESSMENT AND PLAN: Coronary artery disease: No angina reported.  Recent outside lipid panel notable for excellent LDL but mildly elevated triglycerides.  Continue aspirin and atorvastatin.  We will decrease metoprolol succinate to 12.5 mg daily in the setting of progressing 1st degree AV block.  Chronic HFpEF: Trace ankle edema noted, though [name] otherwise appears euvolemic with NYHA class I-II symptoms.  We will decrease metoprolol as above.  Continue ramipril and furosemide at current doses.  Hypertension: BP borderline today but typically better at home.  We will decrease metoprolol succinate to 12.5 mg daily (and may ultimately need to discontinue this altogether) due to lengthening PR interval.  Continue current doses of amlodipine and ramipril.  [name] should contact Lorelee Cover if his blood pressure is consistent above 140/90.  Hyperlipidemia: Recent lipid panel through PCP on 06/11/2020 is notable for total cholesterol of 111, LDL 54, HDL 26, and triglycerides 08/11/2020.  We will continue with atorvastatin 10 mg daily.  I encouraged Gary.  Erickson to stay active to help improve his HDL and triglycerides.  First degree AV block: PR interval has been gradually lengthening.  No symptoms of high-grade AV block/bradycardia reported.  We will decrease metoprolol succinate to 12.5 mg daily and repeat an EKG when we follow-up in 6 months.  Follow-up: Return to clinic in 6 months.  Lorelee Cover, MD 08/07/2020 8:50 AM

## 2020-08-07 NOTE — Patient Instructions (Signed)
Medication Instructions:  Your physician has recommended you make the following change in your medication:   DECREASE Metoprolol Succinate to 12.5mg  DAILY   We have sent in refills for your Nitroglycerin 0.4mg  sublingual tablet  *If you need a refill on your cardiac medications before your next appointment, please call your pharmacy*   Lab Work:  None ordered  Testing/Procedures:  None ordered  Follow-Up: At [facility], you and your health needs are our priority.  As part of our continuing mission to provide you with exceptional heart care, we have created designated Provider Care Teams.  These Care Teams include your primary Cardiologist (physician) and Advanced Practice Providers (APPs -  Physician Assistants and Nurse Practitioners) who all work together to provide you with the care you need, when you need it.  We recommend signing up for the patient portal called "MyChart".  Sign up information is provided on this After Visit Summary.  MyChart is used to connect with patients for Virtual Visits (Telemedicine).  Patients are able to view lab/test results, encounter notes, upcoming appointments, etc.  Non-urgent messages can be sent to your provider as well.   To learn more about what you can do with MyChart, go to [email or web address].    Your next appointment:   6 month(s)  The format for your next appointment:   In Person  Provider:   You may see Yvonne Kendall, MD or one of the following Advanced Practice Providers on your designated Care Team:    Nicolasa Ducking, NP  Eula Listen, PA-C  Marisue Ivan, PA-C  Cadence Fransico Michael, [place]  Gillian Shields, NP    Other Instructions  Please check your blood pressure periodically at home and contact our office if your BP is greater than 140/90.

## 2020-10-24 ENCOUNTER — Other Ambulatory Visit: Payer: Self-pay | Admitting: Nurse Practitioner

## 2020-10-24 ENCOUNTER — Other Ambulatory Visit: Payer: Self-pay | Admitting: Internal Medicine

## 2021-01-22 ENCOUNTER — Other Ambulatory Visit: Payer: Self-pay | Admitting: Internal Medicine

## 2021-01-22 NOTE — Telephone Encounter (Signed)
Please schedule 6 month F/U appointment. Thank you! 

## 2021-01-24 ENCOUNTER — Other Ambulatory Visit: Payer: Self-pay

## 2021-01-24 MED ORDER — FUROSEMIDE 20 MG PO TABS
ORAL_TABLET | ORAL | 3 refills | Status: DC
Start: 2021-01-24 — End: 2021-02-03

## 2021-01-31 ENCOUNTER — Other Ambulatory Visit: Payer: Self-pay | Admitting: Internal Medicine

## 2021-03-05 ENCOUNTER — Other Ambulatory Visit: Payer: Self-pay | Admitting: Internal Medicine

## 2021-03-24 ENCOUNTER — Encounter: Payer: Self-pay | Admitting: Internal Medicine

## 2021-05-29 ENCOUNTER — Ambulatory Visit: Payer: Medicare Other | Admitting: Internal Medicine

## 2021-05-29 ENCOUNTER — Other Ambulatory Visit: Payer: Self-pay

## 2021-05-29 ENCOUNTER — Encounter: Payer: Self-pay | Admitting: Internal Medicine

## 2021-05-29 VITALS — BP 120/70 | HR 102 | Ht 69.0 in | Wt 185.0 lb

## 2021-05-29 DIAGNOSIS — E785 Hyperlipidemia, unspecified: Secondary | ICD-10-CM

## 2021-05-29 DIAGNOSIS — I2510 Atherosclerotic heart disease of native coronary artery without angina pectoris: Secondary | ICD-10-CM

## 2021-05-29 DIAGNOSIS — I5022 Chronic systolic (congestive) heart failure: Secondary | ICD-10-CM | POA: Diagnosis not present

## 2021-05-29 DIAGNOSIS — I25118 Atherosclerotic heart disease of native coronary artery with other forms of angina pectoris: Secondary | ICD-10-CM | POA: Diagnosis not present

## 2021-05-29 DIAGNOSIS — I440 Atrioventricular block, first degree: Secondary | ICD-10-CM

## 2021-05-29 DIAGNOSIS — I10 Essential (primary) hypertension: Secondary | ICD-10-CM | POA: Diagnosis not present

## 2021-05-29 DIAGNOSIS — I5032 Chronic diastolic (congestive) heart failure: Secondary | ICD-10-CM

## 2021-05-29 MED ORDER — AMLODIPINE BESYLATE 10 MG PO TABS: ORAL_TABLET | ORAL | 0 refills | Status: DC

## 2021-05-29 MED ORDER — RAMIPRIL 10 MG PO CAPS: ORAL_CAPSULE | ORAL | 0 refills | Status: DC

## 2021-05-29 MED ORDER — METOPROLOL SUCCINATE ER 25 MG PO TB24: 12.5000 mg | ORAL_TABLET | Freq: Every day | ORAL | 3 refills | Status: AC

## 2021-05-29 MED ORDER — ATORVASTATIN CALCIUM 10 MG PO TABS: ORAL_TABLET | ORAL | 0 refills | Status: DC

## 2021-05-29 NOTE — Patient Instructions (Signed)
Medication Instructions:  ? ?Your physician recommends that you continue on your current medications as directed. Please refer to the Current Medication list given to you today. ? ?*If you need a refill on your cardiac medications before your next appointment, please call your pharmacy* ? ? ?Lab Work: ? ?None ordered ? ?Testing/Procedures: ? ?None ordered ? ? ?Follow-Up: ?At [facility], you and your health needs are our priority.  As part of our continuing mission to provide you with exceptional heart care, we have created designated Provider Care Teams.  These Care Teams include your primary Cardiologist (physician) and Advanced Practice Providers (APPs -  Physician Assistants and Nurse Practitioners) who all work together to provide you with the care you need, when you need it. ? ?We recommend signing up for the patient portal called "MyChart".  Sign up information is provided on this After Visit Summary.  MyChart is used to connect with patients for Virtual Visits (Telemedicine).  Patients are able to view lab/test results, encounter notes, upcoming appointments, etc.  Non-urgent messages can be sent to your provider as well.   ?To learn more about what you can do with MyChart, go to [email or web address].   ? ?Your next appointment:   ? ?Follow up with cardiology at [facility] 3-6 months ? ?You may follow up with our office as needed ? ?

## 2021-05-29 NOTE — Progress Notes (Signed)
? ?Follow-up Outpatient Visit ?Date: 05/29/2021 ? ?Primary Care Provider: ?Fayrene Helper, NP ?[address] ?[place] [address] ? ?Chief Complaint: Follow-up coronary artery disease and heart failure ? ?HPI:  [name] is a 88 y.o. male with history of coronary artery disease status post CABG in 2013 (LIMA to LAD, SVG to D1, and SVG to OM1), hypertension, HFrEF with normalization of LVEF, and syncope, who presents for follow-up of coronary artery disease and cardiomyopathy.  I last saw him in 08/2020, at which time he was feeling well.  Due to lengthening PR interval, we decreased metoprolol succinate to 12.5 mg daily.  We otherwise did not make any medication changes or pursue additional testing. ? ?Today, [name] reports that he has been feeling fairly well.  He had 1 episode of a "funny" feeling in his left arm that was somewhat reminiscent of what he experienced prior to his bypass in 2013.  He was evaluated in the ED at [facility] in [place], [place].  Work-up was unremarkable.  He has not had any recurrence of his symptoms.  He denies chest pain, shortness of breath, palpitations, lightheadedness, and edema.  Blood pressures are typically 110-130/60-80.  He does not feel any different since metoprolol was decreased at our last visit.  He resides near [place] and asks about transitioning his care to a [facility] provider. ? ?-------------------------------------------------------------------------------------------------- ? ?Past Medical History:  ?Diagnosis Date  ? (HFpEF) heart failure with preserved ejection fraction (HCC)   ? a. 06/2017 Echo: EF 55-60%, Gr1 DD, triv AI.  ? CAD (coronary artery disease)   ? a. 2013 S/P CABG x3 [facility] - LIMA0>LAD, VG->D1, VG->OM1); b. 2016 Cath @ [facility] - results not available; c. 08/2017 Lexiscan MV: No ischemia/scar. EF 43%.  ? Chest pain   ? Hyperglycemia   ? Hypertension   ? ?Past Surgical History:  ?Procedure Laterality Date  ? CORONARY ARTERY  BYPASS GRAFT  05/25/2011  ? [facility]: LIMA->LAD, SVG->D1, and SVG-OM1  ? TOTAL HIP ARTHROPLASTY Right   ? May 2021  ? ? ?Current Meds  ?Medication Sig  ? allopurinol (ZYLOPRIM) 100 MG tablet Take 100 mg by mouth daily.  ? amLODipine (NORVASC) 10 MG tablet TAKE 1 TABLET(10 MG) BY MOUTH DAILY  ? aspirin EC 81 MG tablet Take 1 tablet (81 mg total) by mouth daily.  ? atorvastatin (LIPITOR) 10 MG tablet TAKE 1 TABLET(10 MG) BY MOUTH DAILY  ? furosemide (LASIX) 20 MG tablet TAKE 1 TABLET BY MOUTH EVERY MORNING FOR FLUID RETENTION  ? metoprolol succinate (TOPROL-XL) 25 MG 24 hr tablet Take 0.5 tablets (12.5 mg total) by mouth daily.  ? Multiple Vitamins-Minerals (CENTRUM SILVER ULTRA MENS PO) Take 1 tablet by mouth daily.  ? nitroGLYCERIN (NITROSTAT) 0.4 MG SL tablet Place 1 tablet (0.4 mg total) under the tongue every 5 (five) minutes as needed for chest pain.  ? ramipril (ALTACE) 10 MG capsule TAKE 1 CAPSULE BY MOUTH TWICE DAILY  ? ? ?Allergies: Tetanus toxoids ? ?Social History  ? ?Tobacco Use  ? Smoking status: Former  ? Smokeless tobacco: Never  ? Tobacco comments:  ?  Quit in 1990  ?Vaping Use  ? Vaping Use: Never used  ?Substance Use Topics  ? Alcohol use: Not Currently  ? Drug use: Never  ? ? ?Family History  ?Problem Relation Age of Onset  ? Hypertension Mother   ? ? ?Review of Systems: ?A 12-system review of systems was performed and was negative except as noted  in the HPI. ? ?-------------------------------------------------------------------------------------------------- ? ?Physical Exam: ?BP 120/70 (BP Location: Left Arm, Patient Position: Sitting, Cuff Size: Large)   Pulse (!) 102   Ht 5\' 9"  (1.753 m)   Wt 185 lb (83.9 kg)   SpO2 97%   BMI 27.32 kg/m?  ? ?General:  NAD. ?Neck: No JVD or HJR. ?Lungs: Clear to auscultation bilaterally without wheezes or crackles. ?Heart: Tachycardic but regular without murmurs, rubs, or gallops. ?Abdomen: Soft, nontender, nondistended. ?Extremities: Trace pretibial edema  bilaterally ? ?EKG: Sinus tachycardia with isolated PVC, first-degree AV block (PR interval 232 ms), and possible inferior infarct.  Heart rate has increased since 08/07/2020.  PR interval has shortened since then. ? ?Lab Results  ?Component Value Date  ? WBC 10.5 07/05/2017  ? HGB 13.6 07/05/2017  ? HCT 39.7 07/05/2017  ? MCV 95.9 07/05/2017  ? PLT 257 07/05/2017  ? ? ?Lab Results  ?Component Value Date  ? NA 137 08/19/2017  ? K 4.7 08/19/2017  ? CL 98 08/19/2017  ? CO2 26 08/19/2017  ? BUN 14 08/19/2017  ? CREATININE 0.95 08/19/2017  ? GLUCOSE 110 (H) 08/19/2017  ? ALT 35 04/28/2018  ? ? ?Lab Results  ?Component Value Date  ? CHOL 90 04/28/2018  ? HDL 22 (L) 04/28/2018  ? LDLCALC 41 04/28/2018  ? TRIG 134 04/28/2018  ? CHOLHDL 4.1 04/28/2018  ? ? ?-------------------------------------------------------------------------------------------------- ? ?ASSESSMENT AND PLAN: ?Coronary artery disease and left arm discomfort: ?[name] has not had any frank angina.  He was concerned about the recent funny feeling in his left arm, as this may have been an anginal equivalent for him in the past.  Work-up at [facility] ED was unrevealing.  We discussed role for further cardiac evaluation with stress test versus catheterization but have agreed to defer both.  We will continue his current regimen for secondary prevention, including aspirin, atorvastatin, and metoprolol, given that he has mild tachycardia today and less pronounced PR prolongation on today's EKG.  Continue antianginal regimen of amlodipine and metoprolol.  I advised [name] to contact Lorelee Cover if he has any further discomfort in the left arm or develops chest pain/dyspnea. ? ?Chronic HFrEF with recovered ejection fraction: ?[name] appears euvolemic with stable NYHA class II symptoms.  Most recent echocardiogram in our system showed LVEF 55-60% in 2019.  Continue current regimen of metoprolol succinate 12.5 mg daily and ramipril 10  mg twice daily. ? ?First-degree AV block: ?PR prolongation less pronounced on today's tracing after de-escalation of metoprolol succinate at our last visit.  We will continue with low-dose metoprolol and ongoing EKG monitoring of conduction disease. ? ?Hypertension: ?Blood pressure well controlled today.  Continue amlodipine, metoprolol, and ramipril. ? ?Hyperlipidemia: ?LDL at goal on last check in 06/2020 by PCP.  Triglycerides mildly elevated at 216, though it is unclear if this was a fasting sample.  We will plan to continue atorvastatin 10 mg daily. ? ?Follow-up: [name] wishes to transition his cardiac care to a provider closer to home and specifically asks about [facility] health.  I have encouraged him to reach out to his PCP for a referral to establish with a new cardiology provider in the next 3-6 months closer to his home.  He can follow-up with [facility] as needed. ? ?[place], MD ?05/29/2021 ?3:17 PM ? ?

## 2021-05-30 ENCOUNTER — Encounter: Payer: Self-pay | Admitting: Internal Medicine

## 2021-05-31 ENCOUNTER — Other Ambulatory Visit: Payer: Self-pay | Admitting: Internal Medicine

## 2021-06-02 MED ORDER — FUROSEMIDE 20 MG PO TABS: ORAL_TABLET | ORAL | 0 refills | Status: AC

## 2021-08-27 ENCOUNTER — Other Ambulatory Visit: Payer: Self-pay | Admitting: Internal Medicine

## 2021-10-27 ENCOUNTER — Other Ambulatory Visit: Payer: Self-pay | Admitting: Internal Medicine

## 2021-11-26 ENCOUNTER — Other Ambulatory Visit: Payer: Self-pay | Admitting: Internal Medicine

## 2021-12-31 ENCOUNTER — Other Ambulatory Visit: Payer: Self-pay | Admitting: Internal Medicine

## 2022-01-24 ENCOUNTER — Other Ambulatory Visit: Payer: Self-pay | Admitting: Internal Medicine

## 2022-11-22 ENCOUNTER — Other Ambulatory Visit: Payer: Self-pay | Admitting: Internal Medicine

## 2022-11-23 ENCOUNTER — Other Ambulatory Visit: Payer: Self-pay | Admitting: Internal Medicine

## 2023-01-02 ENCOUNTER — Other Ambulatory Visit: Payer: Self-pay | Admitting: Internal Medicine

## 2023-01-04 ENCOUNTER — Other Ambulatory Visit: Payer: Self-pay | Admitting: Internal Medicine
